# Patient Record
Sex: Female | Born: 1975 | Hispanic: No | Marital: Single | Smoking: Former smoker
Health system: Southern US, Community
[De-identification: ages and names within clinical notes are randomized; demographics above are authoritative.]

## PROBLEM LIST (undated history)

## (undated) DIAGNOSIS — Z789 Other specified health status: Secondary | ICD-10-CM

## (undated) HISTORY — PX: WISDOM TOOTH EXTRACTION: SHX21

## (undated) HISTORY — DX: Other specified health status: Z78.9

---

## 2004-12-11 ENCOUNTER — Emergency Department: Payer: Self-pay | Admitting: Emergency Medicine

## 2005-09-11 ENCOUNTER — Emergency Department: Payer: Self-pay | Admitting: Emergency Medicine

## 2006-10-27 ENCOUNTER — Emergency Department: Payer: Self-pay | Admitting: Emergency Medicine

## 2007-07-06 ENCOUNTER — Emergency Department: Payer: Self-pay

## 2007-07-08 ENCOUNTER — Emergency Department: Payer: Self-pay | Admitting: Emergency Medicine

## 2009-09-02 ENCOUNTER — Observation Stay: Payer: Self-pay | Admitting: Obstetrics and Gynecology

## 2009-11-11 ENCOUNTER — Observation Stay: Payer: Self-pay

## 2009-12-17 ENCOUNTER — Observation Stay: Payer: Self-pay | Admitting: Obstetrics and Gynecology

## 2009-12-20 ENCOUNTER — Inpatient Hospital Stay: Payer: Self-pay | Admitting: Obstetrics and Gynecology

## 2012-01-11 ENCOUNTER — Emergency Department: Payer: Self-pay | Admitting: Emergency Medicine

## 2013-06-25 ENCOUNTER — Emergency Department: Payer: Self-pay | Admitting: Emergency Medicine

## 2013-06-25 LAB — CBC WITH DIFFERENTIAL/PLATELET
BASOS PCT: 0.6 %
Basophil #: 0.1 10*3/uL (ref 0.0–0.1)
EOS ABS: 0.1 10*3/uL (ref 0.0–0.7)
Eosinophil %: 0.9 %
HCT: 37.1 % (ref 35.0–47.0)
HGB: 12.7 g/dL (ref 12.0–16.0)
LYMPHS ABS: 3.5 10*3/uL (ref 1.0–3.6)
Lymphocyte %: 36.2 %
MCH: 32.3 pg (ref 26.0–34.0)
MCHC: 34.2 g/dL (ref 32.0–36.0)
MCV: 95 fL (ref 80–100)
MONOS PCT: 7.8 %
Monocyte #: 0.8 x10 3/mm (ref 0.2–0.9)
NEUTROS ABS: 5.3 10*3/uL (ref 1.4–6.5)
Neutrophil %: 54.5 %
Platelet: 304 10*3/uL (ref 150–440)
RBC: 3.92 10*6/uL (ref 3.80–5.20)
RDW: 13.7 % (ref 11.5–14.5)
WBC: 9.7 10*3/uL (ref 3.6–11.0)

## 2013-06-25 LAB — HCG, QUANTITATIVE, PREGNANCY: BETA HCG, QUANT.: 1511 m[IU]/mL — AB

## 2013-07-25 ENCOUNTER — Emergency Department: Payer: Self-pay | Admitting: Emergency Medicine

## 2013-07-25 LAB — URINALYSIS, COMPLETE
BLOOD: NEGATIVE
Bilirubin,UR: NEGATIVE
GLUCOSE, UR: NEGATIVE mg/dL (ref 0–75)
Ketone: NEGATIVE
LEUKOCYTE ESTERASE: NEGATIVE
Nitrite: NEGATIVE
PH: 6 (ref 4.5–8.0)
PROTEIN: NEGATIVE
RBC,UR: <1 /HPF (ref 0–5)
SPECIFIC GRAVITY: 1.014 (ref 1.003–1.030)
Squamous Epithelial: 9

## 2013-07-25 LAB — COMPREHENSIVE METABOLIC PANEL
ALK PHOS: 42 U/L — AB
Albumin: 3.6 g/dL (ref 3.4–5.0)
Anion Gap: 9 (ref 7–16)
BUN: 8 mg/dL (ref 7–18)
Bilirubin,Total: 0.3 mg/dL (ref 0.2–1.0)
CHLORIDE: 105 mmol/L (ref 98–107)
Calcium, Total: 8.9 mg/dL (ref 8.5–10.1)
Co2: 23 mmol/L (ref 21–32)
Creatinine: 0.65 mg/dL (ref 0.60–1.30)
EGFR (African American): >60
EGFR (Non-African Amer.): >60
Glucose: 116 mg/dL — ABNORMAL HIGH (ref 65–99)
Osmolality: 273 (ref 275–301)
POTASSIUM: 3.6 mmol/L (ref 3.5–5.1)
SGOT(AST): 14 U/L — ABNORMAL LOW (ref 15–37)
SGPT (ALT): 19 U/L (ref 12–78)
Sodium: 137 mmol/L (ref 136–145)
Total Protein: 6.9 g/dL (ref 6.4–8.2)

## 2013-07-25 LAB — CBC WITH DIFFERENTIAL/PLATELET
BASOS ABS: 0 10*3/uL (ref 0.0–0.1)
Basophil %: 0.2 %
EOS ABS: 0.1 10*3/uL (ref 0.0–0.7)
EOS PCT: 0.9 %
HCT: 35.6 % (ref 35.0–47.0)
HGB: 11.7 g/dL — ABNORMAL LOW (ref 12.0–16.0)
LYMPHS ABS: 1.9 10*3/uL (ref 1.0–3.6)
Lymphocyte %: 25 %
MCH: 31 pg (ref 26.0–34.0)
MCHC: 32.7 g/dL (ref 32.0–36.0)
MCV: 95 fL (ref 80–100)
MONOS PCT: 7.9 %
Monocyte #: 0.6 x10 3/mm (ref 0.2–0.9)
NEUTROS PCT: 66 %
Neutrophil #: 5.1 10*3/uL (ref 1.4–6.5)
PLATELETS: 233 10*3/uL (ref 150–440)
RBC: 3.76 10*6/uL — AB (ref 3.80–5.20)
RDW: 13.5 % (ref 11.5–14.5)
WBC: 7.7 10*3/uL (ref 3.6–11.0)

## 2013-07-25 LAB — HCG, QUANTITATIVE, PREGNANCY: BETA HCG, QUANT.: 129304 m[IU]/mL — AB

## 2013-07-25 LAB — LIPASE, BLOOD: LIPASE: 194 U/L (ref 73–393)

## 2013-07-27 LAB — URINE CULTURE

## 2013-09-21 ENCOUNTER — Emergency Department: Payer: Self-pay | Admitting: Internal Medicine

## 2013-09-21 LAB — COMPREHENSIVE METABOLIC PANEL
ALK PHOS: 38 U/L — AB
ANION GAP: 7 (ref 7–16)
Albumin: 2.9 g/dL — ABNORMAL LOW (ref 3.4–5.0)
BUN: 8 mg/dL (ref 7–18)
Bilirubin,Total: 0.3 mg/dL (ref 0.2–1.0)
CO2: 22 mmol/L (ref 21–32)
Calcium, Total: 8.2 mg/dL — ABNORMAL LOW (ref 8.5–10.1)
Chloride: 110 mmol/L — ABNORMAL HIGH (ref 98–107)
Creatinine: 0.71 mg/dL (ref 0.60–1.30)
EGFR (African American): >60
EGFR (Non-African Amer.): >60
GLUCOSE: 124 mg/dL — AB (ref 65–99)
Osmolality: 277 (ref 275–301)
Potassium: 3.4 mmol/L — ABNORMAL LOW (ref 3.5–5.1)
SGOT(AST): 14 U/L — ABNORMAL LOW (ref 15–37)
SGPT (ALT): 14 U/L
Sodium: 139 mmol/L (ref 136–145)
TOTAL PROTEIN: 5.4 g/dL — AB (ref 6.4–8.2)

## 2013-09-21 LAB — CBC
HCT: 29.6 % — ABNORMAL LOW (ref 35.0–47.0)
HGB: 9.5 g/dL — AB (ref 12.0–16.0)
MCH: 30.9 pg (ref 26.0–34.0)
MCHC: 32 g/dL (ref 32.0–36.0)
MCV: 97 fL (ref 80–100)
PLATELETS: 155 10*3/uL (ref 150–440)
RBC: 3.06 10*6/uL — ABNORMAL LOW (ref 3.80–5.20)
RDW: 13.7 % (ref 11.5–14.5)
WBC: 10.6 10*3/uL (ref 3.6–11.0)

## 2013-09-21 LAB — HCG, QUANTITATIVE, PREGNANCY: BETA HCG, QUANT.: 52 m[IU]/mL — AB

## 2014-01-14 HISTORY — PX: BREAST SURGERY: SHX581

## 2015-02-22 ENCOUNTER — Emergency Department
Admission: EM | Admit: 2015-02-22 | Discharge: 2015-02-22 | Disposition: A | Discharge status: Home / Self Care | Payer: Self-pay | Attending: Emergency Medicine | Admitting: Emergency Medicine

## 2015-02-22 ENCOUNTER — Encounter: Payer: Self-pay | Admitting: Emergency Medicine

## 2015-02-22 ENCOUNTER — Emergency Department: Payer: Medicaid Other

## 2015-02-22 DIAGNOSIS — J069 Acute upper respiratory infection, unspecified: Secondary | ICD-10-CM | POA: Insufficient documentation

## 2015-02-22 DIAGNOSIS — F17200 Nicotine dependence, unspecified, uncomplicated: Secondary | ICD-10-CM | POA: Insufficient documentation

## 2015-02-22 LAB — RAPID INFLUENZA A&B ANTIGENS
Influenza A (ARMC): NOT DETECTED
Influenza B (ARMC): NOT DETECTED

## 2015-02-22 MED ORDER — IBUPROFEN 800 MG PO TABS
800.0000 mg | ORAL_TABLET | Freq: Once | ORAL | Status: AC
  Administered 2015-02-22: 800 mg via ORAL
  Filled 2015-02-22: qty 1

## 2015-02-22 MED ORDER — GUAIFENESIN-CODEINE 100-10 MG/5ML PO SOLN: 10.0000 mL | ORAL | Status: DC | PRN

## 2015-02-22 MED ORDER — AZITHROMYCIN 250 MG PO TABS: ORAL_TABLET | ORAL | Status: DC

## 2015-02-22 MED ORDER — IBUPROFEN 800 MG PO TABS: 800.0000 mg | ORAL_TABLET | Freq: Three times a day (TID) | ORAL | Status: DC | PRN

## 2015-02-22 NOTE — Discharge Instructions (Signed)
Upper Respiratory Infection, Adult Most upper respiratory infections (URIs) are a viral infection of the air passages leading to the lungs. A URI affects the nose, throat, and upper air passages. The most common type of URI is nasopharyngitis and is typically referred to as "the common cold." URIs run their course and usually go away on their own. Most of the time, a URI does not require medical attention, but sometimes a bacterial infection in the upper airways can follow a viral infection. This is called a secondary infection. Sinus and middle ear infections are common types of secondary upper respiratory infections. Bacterial pneumonia can also complicate a URI. A URI can worsen asthma and chronic obstructive pulmonary disease (COPD). Sometimes, these complications can require emergency medical care and may be life threatening.  CAUSES Almost all URIs are caused by viruses. A virus is a type of germ and can spread from one person to another.  RISKS FACTORS You may be at risk for a URI if:   You smoke.   You have chronic heart or lung disease.  You have a weakened defense (immune) system.   You are very young or very old.   You have nasal allergies or asthma.  You work in crowded or poorly ventilated areas.  You work in health care facilities or schools. SIGNS AND SYMPTOMS  Symptoms typically develop 2-3 days after you come in contact with a cold virus. Most viral URIs last 7-10 days. However, viral URIs from the influenza virus (flu virus) can last 14-18 days and are typically more severe. Symptoms may include:   Runny or stuffy (congested) nose.   Sneezing.   Cough.   Sore throat.   Headache.   Fatigue.   Fever.   Loss of appetite.   Pain in your forehead, behind your eyes, and over your cheekbones (sinus pain).  Muscle aches.  DIAGNOSIS  Your health care provider may diagnose a URI by:  Physical exam.  Tests to check that your symptoms are not due to  another condition such as:  Strep throat.  Sinusitis.  Pneumonia.  Asthma. TREATMENT  A URI goes away on its own with time. It cannot be cured with medicines, but medicines may be prescribed or recommended to relieve symptoms. Medicines may help:  Reduce your fever.  Reduce your cough.  Relieve nasal congestion. HOME CARE INSTRUCTIONS   Take medicines only as directed by your health care provider.   Gargle warm saltwater or take cough drops to comfort your throat as directed by your health care provider.  Use a warm mist humidifier or inhale steam from a shower to increase air moisture. This may make it easier to breathe.  Drink enough fluid to keep your urine clear or pale yellow.   Eat soups and other clear broths and maintain good nutrition.   Rest as needed.   Return to work when your temperature has returned to normal or as your health care provider advises. You may need to stay home longer to avoid infecting others. You can also use a face mask and careful hand washing to prevent spread of the virus.  Increase the usage of your inhaler if you have asthma.   Do not use any tobacco products, including cigarettes, chewing tobacco, or electronic cigarettes. If you need help quitting, ask your health care provider. PREVENTION  The best way to protect yourself from getting a cold is to practice good hygiene.   Avoid oral or hand contact with people with cold   symptoms.   Wash your hands often if contact occurs.  There is no clear evidence that vitamin C, vitamin E, echinacea, or exercise reduces the chance of developing a cold. However, it is always recommended to get plenty of rest, exercise, and practice good nutrition.  SEEK MEDICAL CARE IF:   You are getting worse rather than better.   Your symptoms are not controlled by medicine.   You have chills.  You have worsening shortness of breath.  You have brown or red mucus.  You have yellow or brown nasal  discharge.  You have pain in your face, especially when you bend forward.  You have a fever.  You have swollen neck glands.  You have pain while swallowing.  You have white areas in the back of your throat. SEEK IMMEDIATE MEDICAL CARE IF:   You have severe or persistent:  Headache.  Ear pain.  Sinus pain.  Chest pain.  You have chronic lung disease and any of the following:  Wheezing.  Prolonged cough.  Coughing up blood.  A change in your usual mucus.  You have a stiff neck.  You have changes in your:  Vision.  Hearing.  Thinking.  Mood. MAKE SURE YOU:   Understand these instructions.  Will watch your condition.  Will get help right away if you are not doing well or get worse.   This information is not intended to replace advice given to you by your health care provider. Make sure you discuss any questions you have with your health care provider.   Document Released: 06/26/2000 Document Revised: 05/17/2014 Document Reviewed: 04/07/2013 Elsevier Interactive Patient Education 2016 [employer]  

## 2015-02-22 NOTE — ED Provider Notes (Signed)
[facility] Emergency Department Provider Note  ____________________________________________  Time seen: Approximately 5:21 PM  I have reviewed the triage vital signs and the nursing notes.   HISTORY  Chief Complaint Influenza    HPI Jody Sanders is a 40 y.o. female who presents for evaluation of sudden onset of the fever chills body aches sinus congestion and cough suggests today. Has been taking DayQuil over-the-counter with no relief. States that she coughed all night long and no productive sputum.   History reviewed. No pertinent past medical history.  There are no active problems to display for this patient.   History reviewed. No pertinent past surgical history.  Current Outpatient Rx  Name  Route  Sig  Dispense  Refill  . azithromycin (ZITHROMAX Z-PAK) 250 MG tablet      Take 2 tablets (500 mg) on  Day 1,  followed by 1 tablet (250 mg) once daily on Days 2 through 5.   6 each   0   . guaiFENesin-codeine 100-10 MG/5ML syrup   Oral   Take 10 mLs by mouth every 4 (four) hours as needed for cough.   180 mL   0   . ibuprofen (ADVIL,MOTRIN) 800 MG tablet   Oral   Take 1 tablet (800 mg total) by mouth every 8 (eight) hours as needed.   30 tablet   0     Allergies Review of patient's allergies indicates no known allergies.  No family history on file.  Social History Social History  Substance Use Topics  . Smoking status: Light Tobacco Smoker  . Smokeless tobacco: None  . Alcohol Use: Yes    Review of Systems Constitutional: Positive fever/chills ENT: No sore throat. Cardiovascular: Denies chest pain. Respiratory: Denies shortness of breath. Positive for cough Gastrointestinal: No abdominal pain.  No nausea, no vomiting.  No diarrhea.  No constipation. Genitourinary: Negative for dysuria. Musculoskeletal: Positive for generalized body aches. Skin: Negative for rash. Neurological: Positive for headaches, negative for  focal weakness or numbness.  10-point ROS otherwise negative.  ____________________________________________   PHYSICAL EXAM:  VITAL SIGNS: ED Triage Vitals  Enc Vitals Group     BP 02/22/15 1717 114/84 mmHg     Pulse Rate 02/22/15 1717 98     Resp 02/22/15 1717 20     Temp 02/22/15 1717 100 F (37.8 C)     Temp Source 02/22/15 1717 Oral     SpO2 02/22/15 1717 97 %     Weight 02/22/15 1717 167 lb (75.751 kg)     Height 02/22/15 1717 5\' 3"  (1.6 m)     Head Cir --      Peak Flow --      Pain Score 02/22/15 1715 6     Pain Loc --      Pain Edu? --      Excl. in [place]? --     Constitutional: Alert and oriented. Well appearing and in no acute distress. Nose: Positive congestion/rhinnorhea, nasal turbinates swollen bilaterally.. Mouth/Throat: Mucous membranes are moist.  Oropharynx non-erythematous. Neck: No stridor.  No cervical adenopathy noted. Cardiovascular: Normal rate, regular rhythm. Grossly normal heart sounds.  Good peripheral circulation. Respiratory: Normal respiratory effort.  No retractions. Lungs CTAB. Musculoskeletal: No lower extremity tenderness nor edema.  No joint effusions. Neurologic:  Normal speech and language. No gross focal neurologic deficits are appreciated. No gait instability. Skin:  Skin is warm, dry and intact. No rash noted. Psychiatric: Mood and affect are normal. Speech and  behavior are normal.  ____________________________________________   LABS (all labs ordered are listed, but only abnormal results are displayed)  Labs Reviewed  RAPID INFLUENZA A&B ANTIGENS ([place])   RADIOLOGY   ____________________________________________   PROCEDURES  Procedure(s) performed: None  Critical Care performed: No  ____________________________________________   INITIAL IMPRESSION / ASSESSMENT AND PLAN / ED COURSE  Pertinent labs & imaging results that were available during my care of the patient were reviewed by me and considered in my  medical decision making (see chart for details).  Acute upper respiratory infection. Negative rapid flu and chest x-ray. Rx given for Z-Pak, Robitussin-AC, Motrin) milligrams 3 times a day and chlorpheniramine for drainage. Patient follow-up PCP or return to ER with any worsening symptomology. Work excuse 48 hours given. ____________________________________________   FINAL CLINICAL IMPRESSION(S) / ED DIAGNOSES  Final diagnoses:  URI, acute     This chart was dictated using voice recognition software/Dragon. Despite best efforts to proofread, errors can occur which can change the meaning. Any change was purely unintentional.   Arlyss Repress, PA-C 02/22/15 1901  Nance Pear, MD 02/22/15 [phone]

## 2015-02-22 NOTE — ED Notes (Signed)
Developed body aches with fever and chills since yesterday

## 2016-03-11 DIAGNOSIS — M19049 Primary osteoarthritis, unspecified hand: Secondary | ICD-10-CM | POA: Insufficient documentation

## 2017-01-02 LAB — HM PAP SMEAR: HM Pap smear: NEGATIVE

## 2017-07-22 ENCOUNTER — Emergency Department
Admission: EM | Admit: 2017-07-22 | Discharge: 2017-07-22 | Disposition: A | Discharge status: Home / Self Care | Payer: Self-pay | Attending: Emergency Medicine | Admitting: Emergency Medicine

## 2017-07-22 ENCOUNTER — Encounter: Payer: Self-pay | Admitting: Emergency Medicine

## 2017-07-22 ENCOUNTER — Emergency Department: Payer: Self-pay

## 2017-07-22 DIAGNOSIS — M2141 Flat foot [pes planus] (acquired), right foot: Secondary | ICD-10-CM | POA: Insufficient documentation

## 2017-07-22 DIAGNOSIS — M2142 Flat foot [pes planus] (acquired), left foot: Secondary | ICD-10-CM | POA: Insufficient documentation

## 2017-07-22 DIAGNOSIS — F17200 Nicotine dependence, unspecified, uncomplicated: Secondary | ICD-10-CM | POA: Insufficient documentation

## 2017-07-22 DIAGNOSIS — M722 Plantar fascial fibromatosis: Secondary | ICD-10-CM | POA: Insufficient documentation

## 2017-07-22 MED ORDER — TRAMADOL HCL 50 MG PO TABS: 50.0000 mg | ORAL_TABLET | Freq: Two times a day (BID) | ORAL | 0 refills | Status: DC | PRN

## 2017-07-22 MED ORDER — NAPROXEN 500 MG PO TABS: 500.0000 mg | ORAL_TABLET | Freq: Two times a day (BID) | ORAL | Status: DC

## 2017-07-22 NOTE — Discharge Instructions (Signed)
Advised trial of over-the-counter arch supports.

## 2017-07-22 NOTE — ED Provider Notes (Signed)
[facility] Emergency Department Provider Note   ____________________________________________   First MD Initiated Contact with Patient 07/22/17 [phone]     (approximate)  I have reviewed the triage vital signs and the nursing notes.   HISTORY  Chief Complaint Foot Pain    HPI Jody Sanders is a 42 y.o. female patient complaining left foot and ankle pain without provocative incident.  Patient that she knows the pain while driving last night.  Patient state pain increased this morning.  Patient the pain increased with ambulation.  Patient the pain radiates from her heel to the mid arch of the left foot.  Patient has similar complaint in the past but she states this is the way she ever experience.  Patient rates the pain as 8/10.  Palliative measures for complaint.  History reviewed. No pertinent past medical history.  There are no active problems to display for this patient.   History reviewed. No pertinent surgical history.  Prior to Admission medications   Medication Sig Start Date End Date Taking? Authorizing Provider  azithromycin (ZITHROMAX Z-PAK) 250 MG tablet Take 2 tablets (500 mg) on  Day 1,  followed by 1 tablet (250 mg) once daily on Days 2 through 5. 02/22/15   Beers, Pierce Crane, PA-C  guaiFENesin-codeine 100-10 MG/5ML syrup Take 10 mLs by mouth every 4 (four) hours as needed for cough. 02/22/15   Beers, Pierce Crane, PA-C  ibuprofen (ADVIL,MOTRIN) 800 MG tablet Take 1 tablet (800 mg total) by mouth every 8 (eight) hours as needed. 02/22/15   Beers, Pierce Crane, PA-C  naproxen (NAPROSYN) 500 MG tablet Take 1 tablet (500 mg total) by mouth 2 (two) times daily with a meal. 07/22/17   Sable Feil, PA-C  traMADol (ULTRAM) 50 MG tablet Take 1 tablet (50 mg total) by mouth every 12 (twelve) hours as needed. 07/22/17   Sable Feil, PA-C    Allergies Patient has no known allergies.  No family history on file.  Social History Social History   Tobacco  Use  . Smoking status: Light Tobacco Smoker  Substance Use Topics  . Alcohol use: Yes  . Drug use: Not on file    Review of Systems Constitutional: No fever/chills Eyes: No visual changes. ENT: No sore throat. Cardiovascular: Denies chest pain. Respiratory: Denies shortness of breath. Gastrointestinal: No abdominal pain.  No nausea, no vomiting.  No diarrhea.  No constipation. Genitourinary: Negative for dysuria. Musculoskeletal: Left foot and ankle pain. Skin: Negative for rash. Neurological: Negative for headaches, focal weakness or numbness.   ____________________________________________   PHYSICAL EXAM:  VITAL SIGNS: ED Triage Vitals  Enc Vitals Group     BP 07/22/17 0716 (!) 131/52     Pulse Rate 07/22/17 0716 80     Resp 07/22/17 0716 16     Temp 07/22/17 0716 98.3 F (36.8 C)     Temp Source 07/22/17 0716 Oral     SpO2 07/22/17 0716 99 %     Weight 07/22/17 0717 180 lb (81.6 kg)     Height 07/22/17 0717 5\' 3"  (1.6 m)     Head Circumference --      Peak Flow --      Pain Score 07/22/17 0717 8     Pain Loc --      Pain Edu? --      Excl. in [place]? --    Constitutional: Alert and oriented. Well appearing and in no acute distress. Cardiovascular: Normal rate, regular rhythm.  Grossly normal heart sounds.  Good peripheral circulation. Respiratory: Normal respiratory effort.  No retractions. Lungs CTAB. Musculoskeletal: Bilateral pes planus but no other obvious deformity.  Patient is moderate guarding palpation of the plantar calcaneus.  Patient has decreased range of motion with eversion. Neurologic:  Normal speech and language. No gross focal neurologic deficits are appreciated. No gait instability. Skin:  Skin is warm, dry and intact. No rash noted. Psychiatric: Mood and affect are normal. Speech and behavior are normal.  ____________________________________________   LABS (all labs ordered are listed, but only abnormal results are displayed)  Labs Reviewed -  No data to display ____________________________________________  EKG   ____________________________________________  RADIOLOGY  ED MD interpretation:    Official radiology report(s): Dg Ankle Complete Left  Result Date: 07/22/2017 CLINICAL DATA:  Onset of plantar and lateral ankle pain yesterday. The patient is able to bear weight. No definite injury. EXAM: LEFT ANKLE COMPLETE - 3+ VIEW COMPARISON:  None. FINDINGS: The bones are subjectively adequately mineralized. There is no acute fracture or dislocation. There is no lytic nor blastic lesion. The ankle joint mortise is preserved. The talar dome is intact. Minimal soft tissue swelling is present over the malleolar regions. IMPRESSION: There is no acute bony abnormality of the left ankle. Electronically Signed   By: David  Martinique M.D.   On: 07/22/2017 08:34    ____________________________________________   PROCEDURES  Procedure(s) performed:   Procedures  Critical Care performed: No  ____________________________________________   INITIAL IMPRESSION / ASSESSMENT AND PLAN / ED COURSE  As part of my medical decision making, I reviewed the following data within the electronic MEDICAL RECORD NUMBER    Left foot and ankle pain secondary to pes planus.  Discussed x-ray findings with patient.  Patient placed in Ace wrap.  Patient given discharge care instruction advised take medication as directed.  Patient advised to try over-the-counter inserts and schedule appointment with podiatry.      ____________________________________________   FINAL CLINICAL IMPRESSION(S) / ED DIAGNOSES  Final diagnoses:  Plantar fasciitis of right foot  Plantar fasciitis of left foot  Pes planus of both feet     ED Discharge Orders        Ordered    naproxen (NAPROSYN) 500 MG tablet  2 times daily with meals     07/22/17 0853    traMADol (ULTRAM) 50 MG tablet  Every 12 hours PRN     07/22/17 0853       Note:  This document was prepared  using Dragon voice recognition software and may include unintentional dictation errors.    Sable Feil, PA-C 07/22/17 4585    Lavonia Drafts, MD 07/22/17 [phone]

## 2017-07-22 NOTE — ED Triage Notes (Signed)
Patient complaining of right foot pain onset last PM while driving.  Woke this AM with same pain.  Denies injury.  Indicates pain inner aspect near arch.  Occurred while driving.

## 2018-02-21 ENCOUNTER — Emergency Department
Admission: EM | Admit: 2018-02-21 | Discharge: 2018-02-21 | Disposition: A | Discharge status: Home / Self Care | Payer: Self-pay | Attending: Emergency Medicine | Admitting: Emergency Medicine

## 2018-02-21 ENCOUNTER — Other Ambulatory Visit: Payer: Self-pay

## 2018-02-21 DIAGNOSIS — Z79899 Other long term (current) drug therapy: Secondary | ICD-10-CM | POA: Insufficient documentation

## 2018-02-21 DIAGNOSIS — Z720 Tobacco use: Secondary | ICD-10-CM | POA: Insufficient documentation

## 2018-02-21 DIAGNOSIS — J111 Influenza due to unidentified influenza virus with other respiratory manifestations: Secondary | ICD-10-CM | POA: Insufficient documentation

## 2018-02-21 MED ORDER — GUAIFENESIN-CODEINE 100-10 MG/5ML PO SYRP
5.0000 mL | ORAL_SOLUTION | Freq: Three times a day (TID) | ORAL | 0 refills | Status: DC | PRN
Start: 2018-02-21 — End: 2018-11-04

## 2018-02-21 MED ORDER — ACETAMINOPHEN 500 MG PO TABS
1000.0000 mg | ORAL_TABLET | Freq: Once | ORAL | Status: AC
  Administered 2018-02-21: 1000 mg via ORAL
  Filled 2018-02-21: qty 2

## 2018-02-21 MED ORDER — KETOROLAC TROMETHAMINE 30 MG/ML IJ SOLN
30.0000 mg | Freq: Once | INTRAMUSCULAR | Status: AC
  Administered 2018-02-21: 30 mg via INTRAMUSCULAR
  Filled 2018-02-21: qty 1

## 2018-02-21 NOTE — ED Triage Notes (Signed)
Pt states 3 days of cough, headache that began today and body aches. Pt states has had fever at home. Pt in no acute distress.

## 2018-02-21 NOTE — Discharge Instructions (Signed)
Stay well hydrated.  Take Extra Strength Tylenol and Ibuprofen in rotation for body aches, fever, and headache.  Follow up with primary care or return to the ER for symptoms of concern.

## 2018-02-22 NOTE — ED Provider Notes (Signed)
[facility] Emergency Department Provider Note  ____________________________________________  Time seen: Approximately 12:13 AM  I have reviewed the triage vital signs and the nursing notes.   HISTORY  Chief Complaint Cough and Headache   HPI Jody Sanders is a 43 y.o. female presents to the emergency department for treatment and evaluation of cough, headache, fever, and body aches.  She has had symptoms for 3 days.  No relief with over-the-counter medications.    No past medical history on file.  There are no active problems to display for this patient.   No past surgical history on file.  Prior to Admission medications   Medication Sig Start Date End Date Taking? Authorizing Provider  azithromycin (ZITHROMAX Z-PAK) 250 MG tablet Take 2 tablets (500 mg) on  Day 1,  followed by 1 tablet (250 mg) once daily on Days 2 through 5. 02/22/15   Beers, Pierce Crane, PA-C  guaiFENesin-codeine (ROBITUSSIN AC) 100-10 MG/5ML syrup Take 5 mLs by mouth 3 (three) times daily as needed for cough. 02/21/18   Teretha Chalupa, Johnette Abraham B, FNP  ibuprofen (ADVIL,MOTRIN) 800 MG tablet Take 1 tablet (800 mg total) by mouth every 8 (eight) hours as needed. 02/22/15   Beers, Pierce Crane, PA-C  naproxen (NAPROSYN) 500 MG tablet Take 1 tablet (500 mg total) by mouth 2 (two) times daily with a meal. 07/22/17   Sable Feil, PA-C  traMADol (ULTRAM) 50 MG tablet Take 1 tablet (50 mg total) by mouth every 12 (twelve) hours as needed. 07/22/17   Sable Feil, PA-C    Allergies Patient has no known allergies.  No family history on file.  Social History Social History   Tobacco Use  . Smoking status: Light Tobacco Smoker  Substance Use Topics  . Alcohol use: Yes  . Drug use: Not on file    Review of Systems Constitutional: Positive for fever/chills.  Decreased appetite. ENT: Positive for sore throat. Cardiovascular: Denies chest pain. Respiratory: Negative for shortness of breath.  Positive  for cough.  Negative for wheezing.  Gastrointestinal: No nausea, no vomiting.  Negative for diarrhea.  Musculoskeletal: Positive for body aches Skin: Negative for rash. Neurological: Positive for headaches ____________________________________________   PHYSICAL EXAM:  VITAL SIGNS: ED Triage Vitals [02/21/18 1932]  Enc Vitals Group     BP 137/78     Pulse Rate 87     Resp 16     Temp 99.2 F (37.3 C)     Temp Source Oral     SpO2 100 %     Weight 180 lb (81.6 kg)     Height 5\' 3"  (1.6 m)     Head Circumference      Peak Flow      Pain Score 9     Pain Loc      Pain Edu?      Excl. in [place]?     Constitutional: Alert and oriented.  Acutely ill appearing and in no acute distress. Eyes: Conjunctivae are normal. Ears: Bilateral tympanic membranes are injected and mildly erythematous Nose: No sinus congestion noted; no rhinnorhea. Mouth/Throat: Mucous membranes are moist.  Oropharynx erythematous. Tonsils 1+ without exudate. Uvula midline. Neck: No stridor.  Lymphatic: No cervical lymphadenopathy. Cardiovascular: Normal rate, regular rhythm. Good peripheral circulation. Respiratory: Respirations are even and unlabored.  No retractions.  Breath sounds clear. Gastrointestinal: Soft and nontender.  Musculoskeletal: FROM x 4 extremities.  Neurologic:  Normal speech and language. Skin:  Skin is warm, dry and intact.  No rash noted. Psychiatric: Mood and affect are normal. Speech and behavior are normal.  ____________________________________________   LABS (all labs ordered are listed, but only abnormal results are displayed)  Labs Reviewed - No data to display ____________________________________________  EKG  Not indicated ____________________________________________  RADIOLOGY  Not indicated ____________________________________________   PROCEDURES  Procedure(s) performed: None  Critical Care performed:  No ____________________________________________   INITIAL IMPRESSION / ASSESSMENT AND PLAN / ED COURSE  43 y.o. female presents to the emergency department for treatment and evaluation of flulike symptoms.  Her main complaint at this time is a headache.  She will be given an injection of Toradol.   Little improvement of the headache after Toradol.  She is driving therefore cannot give her anything stronger than Tylenol at this time.  Patient to be discharged home and will be treated with Robitussin-AC and advised to take Tylenol and ibuprofen for pain and fever.  She was advised to see primary care if not improving over the next few days.  She was instructed to return to the emergency department for symptoms of change or worsen if unable to schedule an appointment.    Medications  ketorolac (TORADOL) 30 MG/ML injection 30 mg (30 mg Intramuscular Given 02/21/18 2116)  acetaminophen (TYLENOL) tablet 1,000 mg (1,000 mg Oral Given 02/21/18 2146)    ED Discharge Orders         Ordered    guaiFENesin-codeine (ROBITUSSIN AC) 100-10 MG/5ML syrup  3 times daily PRN     02/21/18 2101           Pertinent labs & imaging results that were available during my care of the patient were reviewed by me and considered in my medical decision making (see chart for details).    If controlled substance prescribed during this visit, 12 month history viewed on the Power prior to issuing an initial prescription for Schedule II or III opiod. ____________________________________________   FINAL CLINICAL IMPRESSION(S) / ED DIAGNOSES  Final diagnoses:  Influenza    Note:  This document was prepared using Dragon voice recognition software and may include unintentional dictation errors.     Victorino Dike, FNP 02/22/18 0016    Carrie Mew, MD 02/27/18 [phone]

## 2018-10-07 ENCOUNTER — Other Ambulatory Visit: Payer: Self-pay | Admitting: *Deleted

## 2018-10-07 DIAGNOSIS — Z20822 Contact with and (suspected) exposure to covid-19: Secondary | ICD-10-CM

## 2018-10-08 LAB — NOVEL CORONAVIRUS, NAA: SARS-CoV-2, NAA: NOT DETECTED

## 2018-10-19 ENCOUNTER — Ambulatory Visit: Payer: Medicaid Other | Admitting: Maternal Newborn

## 2018-11-04 ENCOUNTER — Other Ambulatory Visit (HOSPITAL_COMMUNITY)
Admission: RE | Admit: 2018-11-04 | Discharge: 2018-11-04 | Disposition: A | Discharge status: Home / Self Care | Payer: Medicaid Other | Source: Ambulatory Visit | Attending: Maternal Newborn | Admitting: Maternal Newborn

## 2018-11-04 ENCOUNTER — Ambulatory Visit (INDEPENDENT_AMBULATORY_CARE_PROVIDER_SITE_OTHER): Payer: Medicaid Other | Admitting: Maternal Newborn

## 2018-11-04 ENCOUNTER — Other Ambulatory Visit: Payer: Self-pay

## 2018-11-04 ENCOUNTER — Encounter: Payer: Self-pay | Admitting: Maternal Newborn

## 2018-11-04 VITALS — BP 120/80 | HR 79 | Ht 63.0 in | Wt 180.0 lb

## 2018-11-04 DIAGNOSIS — Z1231 Encounter for screening mammogram for malignant neoplasm of breast: Secondary | ICD-10-CM

## 2018-11-04 DIAGNOSIS — Z0000 Encounter for general adult medical examination without abnormal findings: Secondary | ICD-10-CM | POA: Diagnosis not present

## 2018-11-04 DIAGNOSIS — Z113 Encounter for screening for infections with a predominantly sexual mode of transmission: Secondary | ICD-10-CM | POA: Insufficient documentation

## 2018-11-04 DIAGNOSIS — R109 Unspecified abdominal pain: Secondary | ICD-10-CM

## 2018-11-04 DIAGNOSIS — R197 Diarrhea, unspecified: Secondary | ICD-10-CM

## 2018-11-04 DIAGNOSIS — Z124 Encounter for screening for malignant neoplasm of cervix: Secondary | ICD-10-CM | POA: Diagnosis not present

## 2018-11-04 DIAGNOSIS — Z01419 Encounter for gynecological examination (general) (routine) without abnormal findings: Secondary | ICD-10-CM

## 2018-11-04 DIAGNOSIS — B379 Candidiasis, unspecified: Secondary | ICD-10-CM

## 2018-11-04 DIAGNOSIS — L739 Follicular disorder, unspecified: Secondary | ICD-10-CM

## 2018-11-04 MED ORDER — CLOTRIMAZOLE-BETAMETHASONE 1-0.05 % EX CREA: 1.0000 "application " | TOPICAL_CREAM | Freq: Two times a day (BID) | CUTANEOUS | 0 refills | Status: DC

## 2018-11-04 MED ORDER — CEPHALEXIN 500 MG PO CAPS: 500.0000 mg | ORAL_CAPSULE | Freq: Two times a day (BID) | ORAL | 0 refills | Status: AC

## 2018-11-04 MED ORDER — FLUCONAZOLE 150 MG PO TABS: 150.0000 mg | ORAL_TABLET | Freq: Once | ORAL | 0 refills | Status: AC

## 2018-11-04 NOTE — Progress Notes (Signed)
Gynecology Annual Exam  PCP: [facility] [place]  Chief Complaint:  Chief Complaint  Patient presents with  . Gynecologic Exam    rash    History of Present Illness: Patient is a 43 y.o. G0P0000 presenting for an annual exam. The patient has no complaints today.   LMP: Patient's last menstrual period was 10/24/2018 (exact date). Average Interval: regular, monthly Duration of flow: several days Heavy Menses: yes Clots: yes Intermenstrual Bleeding: no Postcoital Bleeding: no Dysmenorrhea: no  The patient is sexually active. She currently uses condoms for contraception. She does not dyspareunia.  The patient does perform self breast exams.  There is no notable family history of breast or ovarian cancer in her family.  The patient wears seatbelts: yes.   The patient has regular exercise: yes.  Recently quit smoking.  The patient does not have current symptoms of depression.    She notes that she has been having burning stomach pain with frequent diarrhea/loose stools.This problem began 6-7 months ago. She has tried Immodium, which did not relieve the symptoms. She has these symptoms several times a week. They seem to be unrelated to foods that she is eating.  She has a rash in the pubic area that began about a month ago after she had the area waxed. She has had some pustules that have healed, but has continued irritation and itching with some persistent raised spots. She has been using ointment on the area without much relief.  Review of Systems  Constitutional: Negative.   HENT: Negative.   Eyes: Negative.   Respiratory: Negative for shortness of breath and wheezing.   Cardiovascular: Negative for chest pain and palpitations.  Gastrointestinal: Positive for abdominal pain and diarrhea.  Genitourinary: Negative.   Musculoskeletal: Negative.   Skin: Positive for rash.  Neurological: Negative.   Endo/Heme/Allergies: Negative.   Psychiatric/Behavioral:  Negative.   All other systems reviewed and are negative.   Past Medical History:  No past medical history on file.  Past Surgical History:  Past Surgical History:  Procedure Laterality Date  . WISDOM TOOTH EXTRACTION      Gynecologic History:  Patient's last menstrual period was 10/24/2018 (exact date). Contraception: condoms Last Pap: Several years ago Last mammogram: No previous mammograms  Family History:  Family History  Problem Relation Age of Onset  . Diabetes Mother   . Thyroid disease Mother   . COPD Mother     Social History:  Social History   Socioeconomic History  . Marital status: Single    Spouse name: Not on file  . Number of children: 2  . Years of education: 40  . Highest education level: Not on file  Occupational History  . Occupation: [employer]  Social Needs  . Financial resource strain: Not on file  . Food insecurity    Worry: Not on file    Inability: Not on file  . Transportation needs    Medical: Not on file    Non-medical: Not on file  Tobacco Use  . Smoking status: Former Smoker    Quit date: 10/21/2018    Years since quitting: 0.0  . Smokeless tobacco: Never Used  Substance and Sexual Activity  . Alcohol use: Yes  . Drug use: Never  . Sexual activity: Yes    Birth control/protection: Condom  Lifestyle  . Physical activity    Days per week: Not on file    Minutes per session: Not on file  . Stress: Not on file  Relationships  . Social Herbalist on phone: Not on file    Gets together: Not on file    Attends religious service: Not on file    Active member of club or organization: Not on file    Attends meetings of clubs or organizations: Not on file    Relationship status: Not on file  . Intimate partner violence    Fear of current or ex partner: Not on file    Emotionally abused: Not on file    Physically abused: Not on file    Forced sexual activity: Not on file  Other Topics Concern  . Not on file  Social  History Narrative  . Not on file    Allergies:  No Known Allergies  Medications: Prior to Admission medications   Medication Sig Start Date End Date Taking? Authorizing Provider  azithromycin (ZITHROMAX Z-PAK) 250 MG tablet Take 2 tablets (500 mg) on  Day 1,  followed by 1 tablet (250 mg) once daily on Days 2 through 5. 02/22/15   Beers, Pierce Crane, PA-C  guaiFENesin-codeine (ROBITUSSIN AC) 100-10 MG/5ML syrup Take 5 mLs by mouth 3 (three) times daily as needed for cough. 02/21/18   Triplett, Johnette Abraham B, FNP  ibuprofen (ADVIL,MOTRIN) 800 MG tablet Take 1 tablet (800 mg total) by mouth every 8 (eight) hours as needed. 02/22/15   Beers, Pierce Crane, PA-C  naproxen (NAPROSYN) 500 MG tablet Take 1 tablet (500 mg total) by mouth 2 (two) times daily with a meal. 07/22/17   Sable Feil, PA-C  traMADol (ULTRAM) 50 MG tablet Take 1 tablet (50 mg total) by mouth every 12 (twelve) hours as needed. 07/22/17   Sable Feil, PA-C    Physical Exam Vitals: Blood pressure 120/80, pulse 79, height 5\' 3"  (1.6 m), weight 180 lb (81.6 kg), last menstrual period 10/24/2018.  General: NAD HEENT: normocephalic, anicteric Thyroid: no enlargement Pulmonary: No increased work of breathing, CTAB Cardiovascular: RRR, no murmurs, rubs, or gallops Breast: Breasts symmetrical, no tenderness, no palpable nodules or masses, no skin or nipple retraction present, no nipple discharge.  No axillary or supraclavicular lymphadenopathy. Abdomen: Soft, non-tender, non-distended.  Umbilicus without lesions.  No hepatomegaly, splenomegaly or masses palpable. No evidence of hernia  Genitourinary:  External: Folliculitis present on mons pubis with some  healed areas and some small pustules. Otherwise,  normal  external female genitalia.  Normal urethral  meatus,  normal Bartholin's and Skene's glands.    Vagina: Normal vaginal mucosa, no evidence of prolapse.    Cervix: Grossly normal in appearance, no bleeding  Uterus: Non-enlarged,  mobile, normal contour.  No CMT  Adnexa: ovaries non-enlarged, no adnexal masses  Rectal: deferred  Lymphatic: no evidence of inguinal lymphadenopathy Extremities: no edema, erythema, or tenderness Neurologic: Grossly intact Psychiatric: mood appropriate, affect full  Assessment: 43 y.o. G0P0000 here for an annual exam  Plan: Problem List Items Addressed This Visit    None    Visit Diagnoses    Encounter for annual routine gynecological examination    -  Primary   Breast cancer screening by mammogram       Relevant Orders   MM DIGITAL SCREENING BILATERAL   Folliculitis       Relevant Medications   clotrimazole-betamethasone (LOTRISONE) cream   cephALEXin (KEFLEX) 500 MG capsule   Yeast infection       Relevant Medications   clotrimazole-betamethasone (LOTRISONE) cream   cephALEXin (KEFLEX) 500 MG capsule   fluconazole (DIFLUCAN) 150  MG tablet   Pap smear for cervical cancer screening       Relevant Orders   Cytology - PAP   Routine screening for STI (sexually transmitted infection)       Relevant Orders   Cytology - PAP      1) Mammogram - recommend yearly screening mammogram.  Mammogram was ordered today  2) STI screening was offered and accepted  3) ASCCP guidelines and rationale discussed.  Patient opts for every 3 year screening interval  4) Contraception - Currently using condoms, satisfied with this method for now.  5) Rx sent for topical cream and short course of oral antibiotics for persistent folliculitis.  6) Refer to GI for ongoing stomach pain with diarrhea.  7) Follow up in one year for an annual exam.  Avel Sensor, CNM 11/04/2018  5:15 PM

## 2018-11-04 NOTE — Patient Instructions (Signed)

## 2018-11-09 LAB — CYTOLOGY - PAP
Chlamydia: NEGATIVE
Comment: NEGATIVE
Comment: NEGATIVE
Comment: NEGATIVE
Comment: NORMAL
Diagnosis: NEGATIVE
High risk HPV: NEGATIVE
Neisseria Gonorrhea: NEGATIVE
Trichomonas: NEGATIVE

## 2018-11-19 ENCOUNTER — Encounter: Payer: Self-pay | Admitting: Maternal Newborn

## 2018-11-19 ENCOUNTER — Other Ambulatory Visit: Payer: Self-pay | Admitting: Maternal Newborn

## 2018-11-19 DIAGNOSIS — Z1231 Encounter for screening mammogram for malignant neoplasm of breast: Secondary | ICD-10-CM

## 2018-11-23 ENCOUNTER — Other Ambulatory Visit: Payer: Self-pay | Admitting: Maternal Newborn

## 2018-11-23 DIAGNOSIS — Z1231 Encounter for screening mammogram for malignant neoplasm of breast: Secondary | ICD-10-CM

## 2018-11-30 ENCOUNTER — Telehealth: Payer: Self-pay | Admitting: Maternal Newborn

## 2018-11-30 NOTE — Telephone Encounter (Signed)
Called and spoke with patient she is aware of her appointment at [facility] on Thursday 12/03/2018 @ 1:20 pm.

## 2019-01-04 ENCOUNTER — Other Ambulatory Visit: Payer: Self-pay

## 2019-01-04 ENCOUNTER — Encounter: Payer: Self-pay | Admitting: Gastroenterology

## 2019-01-04 ENCOUNTER — Ambulatory Visit: Payer: Medicaid Other | Admitting: Gastroenterology

## 2019-01-04 VITALS — BP 138/88 | HR 101 | Temp 98.3°F | Ht 63.0 in | Wt 174.5 lb

## 2019-01-04 DIAGNOSIS — K625 Hemorrhage of anus and rectum: Secondary | ICD-10-CM | POA: Diagnosis not present

## 2019-01-04 DIAGNOSIS — K644 Residual hemorrhoidal skin tags: Secondary | ICD-10-CM | POA: Diagnosis not present

## 2019-01-04 DIAGNOSIS — R1013 Epigastric pain: Secondary | ICD-10-CM

## 2019-01-04 MED ORDER — NA SULFATE-K SULFATE-MG SULF 17.5-3.13-1.6 GM/177ML PO SOLN: 354.0000 mL | Freq: Once | ORAL | 0 refills | Status: AC

## 2019-01-04 NOTE — Progress Notes (Signed)
Jody Sanders [address]  [place]  [place], [place] [address]  Main: [phone]  Fax: [phone]   Gastroenterology Consultation  Referring Provider:     Rexene Agent, CNM Primary Care Physician:  [facility] Reason for Consultation: Stomach pain, diarrhea        HPI:    Chief Complaint  Patient presents with  . New Patient (Initial Visit)  . Diarrhea    Patient states she has had a burning feeling in stomach and diarrhea. it comes and goes     Jody Sanders is a 43 y.o. y/o female referred for consultation & management  by Dr. Domingo Madeira, [place].  Patient reports 1 year history of altered bowel habits.  Reports 3-4 loose stools a day, however does have days where she will not have a bowel movement.  Has noticed blood in her stool about 3-4 times.  Last episode was about 2 to 3 months ago.  She describes this as red blood streaks in stool.  No weight loss.  Describes an abdominal pain to be in the epigastric to mid abdominal region, burning, nonradiating, 5/10.  Reports history of a colonoscopy about 18 years ago otherwise had an episode of blood in her stool at that filled the whole toilet bowl.  States hemorrhoids were found.  We do not have these previous colonoscopy records.  Does feel extra skin when she wipes.  No family history of colon cancer.  History reviewed. No pertinent past medical history.  Past Surgical History:  Procedure Laterality Date  . WISDOM TOOTH EXTRACTION      Prior to Admission medications   Medication Sig Start Date End Date Taking? Authorizing Provider  Na Sulfate-K Sulfate-Mg Sulf 17.5-3.13-1.6 GM/177ML SOLN Take 354 mLs by mouth once for 1 dose. 01/04/19 01/04/19  Virgel Manifold, MD    Family History  Problem Relation Age of Onset  . Diabetes Mother   . Thyroid disease Mother   . COPD Mother      Social History   Tobacco Use  . Smoking status: Former Smoker      Types: Cigarettes    Quit date: 10/21/2018    Years since quitting: 0.2  . Smokeless tobacco: Never Used  Substance Use Topics  . Alcohol use: Yes  . Drug use: Never    Allergies as of 01/04/2019  . (No Known Allergies)    Review of Systems:    All systems reviewed and negative except where noted in HPI.   Physical Exam:  BP 138/88 (BP Location: Left Arm, Patient Position: Sitting, Cuff Size: Normal)   Pulse (!) 101   Temp 98.3 F (36.8 C) (Oral)   Ht 5\' 3"  (1.6 m)   Wt 174 lb 8 oz (79.2 kg)   BMI 30.91 kg/m  No LMP recorded. (Menstrual status: Other). Psych:  Alert and cooperative. Normal mood and affect. General:   Alert,  Well-developed, well-nourished, pleasant and cooperative in NAD Head:  Normocephalic and atraumatic. Eyes:  Sclera clear, no icterus.   Conjunctiva pink. Ears:  Normal auditory acuity. Nose:  No deformity, discharge, or lesions. Mouth:  No deformity or lesions,oropharynx pink & moist. Neck:  Supple; no masses or thyromegaly. Abdomen:  Normal bowel sounds.  No bruits.  Soft, non-tender and non-distended without masses, hepatosplenomegaly or hernias noted.  No guarding or rebound tenderness.    Rectal exam: External hemorrhoids, nonbleeding, DRE with yellow stool in the rectal vault Msk:  Symmetrical without gross deformities. Good, equal movement & strength bilaterally. Pulses:  Normal pulses noted. Extremities:  No clubbing or edema.  No cyanosis. Neurologic:  Alert and oriented x3;  grossly normal neurologically. Skin:  Intact without significant lesions or rashes. No jaundice. Lymph Nodes:  No significant cervical adenopathy. Psych:  Alert and cooperative. Normal mood and affect.   Labs: CBC    Component Value Date/Time   WBC 10.6 09/21/2013 1353   RBC 3.06 (L) 09/21/2013 1353   HGB 9.5 (L) 09/21/2013 1353   HCT 29.6 (L) 09/21/2013 1353   PLT 155 09/21/2013 1353   MCV 97 09/21/2013 1353   MCH 30.9 09/21/2013 1353   MCHC 32.0 09/21/2013  1353   RDW 13.7 09/21/2013 1353   LYMPHSABS 1.9 07/25/2013 1644   MONOABS 0.6 07/25/2013 1644   EOSABS 0.1 07/25/2013 1644   BASOSABS 0.0 07/25/2013 1644   CMP     Component Value Date/Time   NA 139 09/21/2013 1353   K 3.4 (L) 09/21/2013 1353   CL 110 (H) 09/21/2013 1353   CO2 22 09/21/2013 1353   GLUCOSE 124 (H) 09/21/2013 1353   BUN 8 09/21/2013 1353   CREATININE 0.71 09/21/2013 1353   CALCIUM 8.2 (L) 09/21/2013 1353   PROT 5.4 (L) 09/21/2013 1353   ALBUMIN 2.9 (L) 09/21/2013 1353   AST 14 (L) 09/21/2013 1353   ALT 14 09/21/2013 1353   ALKPHOS 38 (L) 09/21/2013 1353   BILITOT 0.3 09/21/2013 1353   GFRNONAA >60 09/21/2013 1353   GFRAA >60 09/21/2013 1353    Imaging Studies: No results found.  Assessment and Plan:   Jody Sanders is a 43 y.o. y/o female has been referred for altered bowel habits, diarrhea, epigastric pain  Diarrhea unlikely to be from infectious causes given chronic symptoms over the last year  However, patient also reports blood streaks in stool intermittently.  We discussed that this is likely due to her underlying hemorrhoids, however she is not having constipation at this time and is interested in colonoscopy for visualization  This will help [place] rule out any underlying lesions as well  Colonoscopy indicated for rectal bleeding and altered bowel habits.  Obtain biopsies for microscopic colitis during the procedure  EGD indicated for ongoing epigastric pain  Patient also interested in surgical referral for external hemorrhoids as she states she has tried topical therapy which has not helped    Dr Jody Sanders  Speech recognition software was used to dictate the above note.

## 2019-01-11 ENCOUNTER — Telehealth: Payer: Self-pay

## 2019-01-11 NOTE — Telephone Encounter (Signed)
Trish in ENDO called stating that the letter stating the patient was positive will not work. They need the test results that she was positive. Informed Trish when patient showed me the letter I called Kieth Brightly and she stated that the letter would work. Trish said no they need the results. Called the patient and patient states she will try to track down the results and fax it to [place].

## 2019-01-13 ENCOUNTER — Encounter: Payer: Self-pay | Admitting: Surgery

## 2019-01-13 ENCOUNTER — Ambulatory Visit (INDEPENDENT_AMBULATORY_CARE_PROVIDER_SITE_OTHER): Payer: Medicaid Other | Admitting: Surgery

## 2019-01-13 ENCOUNTER — Other Ambulatory Visit: Payer: Self-pay

## 2019-01-13 VITALS — BP 113/79 | HR 77 | Temp 97.5°F | Ht 63.0 in | Wt 178.0 lb

## 2019-01-13 DIAGNOSIS — K648 Other hemorrhoids: Secondary | ICD-10-CM | POA: Diagnosis not present

## 2019-01-13 DIAGNOSIS — K644 Residual hemorrhoidal skin tags: Secondary | ICD-10-CM

## 2019-01-13 MED ORDER — LIDOCAINE (ANORECTAL) 5 % EX CREA: 1.0000 | TOPICAL_CREAM | Freq: Four times a day (QID) | CUTANEOUS | 0 refills | Status: DC | PRN

## 2019-01-13 MED ORDER — HYDROCORTISONE (PERIANAL) 2.5 % EX CREA: 1.0000 "application " | TOPICAL_CREAM | Freq: Two times a day (BID) | CUTANEOUS | 0 refills | Status: DC

## 2019-01-13 NOTE — Progress Notes (Signed)
01/13/2019  Reason for Visit:  Hemorrhoids  Referring Provider:  Vonda Antigua, MD  History of Present Illness: Jody Sanders is a 43 y.o. female presenting for evaluation of hemorrhoids.  The patient reports that she has had issues with hemorrhoids since about 9 years ago, since her last child was born.  She reports that more recently, she has had issues with feeling pain and swelling sensation at the anal verge, as well as occasional bleeding.  She feels that something protrudes and describes it as a "thumb" at the anal verge.  She has tried suppositories, preparation H, witch hazel, but has not noted any significant improvement.  She has been following with Dr. Bonna Gains for abdominal discomfort and diarrhea.  She reports that more recently the diarrhea has been improving.  Past Medical History: History reviewed. No pertinent past medical history.   Past Surgical History: Past Surgical History:  Procedure Laterality Date  . BREAST SURGERY  2016   Breast Reduction  . WISDOM TOOTH EXTRACTION      Home Medications: Prior to Admission medications   Medication Sig Start Date End Date Taking? Authorizing Provider  hydrocortisone (ANUSOL-HC) 2.5 % rectal cream Place 1 application rectally 2 (two) times daily. 01/13/19   Rasheen Bells, Jacqulyn Bath, MD  Lidocaine, Anorectal, 5 % CREA Apply 1 applicator topically 4 (four) times daily as needed. 01/13/19   Olean Ree, MD    Allergies: No Known Allergies  Social History:  reports that she quit smoking about 2 months ago. Her smoking use included cigarettes. She has never used smokeless tobacco. She reports current alcohol use. She reports that she does not use drugs.   Family History: Family History  Problem Relation Age of Onset  . Diabetes Mother   . Thyroid disease Mother   . COPD Mother     Review of Systems: Review of Systems  Constitutional: Negative for chills and fever.  HENT: Negative for hearing loss.   Respiratory:  Negative for shortness of breath.   Cardiovascular: Negative for chest pain.  Gastrointestinal: Positive for abdominal pain, blood in stool and diarrhea. Negative for constipation, nausea and vomiting.  Genitourinary: Negative for dysuria.  Musculoskeletal: Negative for myalgias.  Skin: Negative for rash.  Neurological: Negative for dizziness.  Psychiatric/Behavioral: Negative for depression.    Physical Exam BP 113/79   Pulse 77   Temp (!) 97.5 F (36.4 C) (Temporal)   Ht 5\' 3"  (1.6 m)   Wt 80.7 kg   SpO2 97%   BMI 31.53 kg/m  CONSTITUTIONAL: No acute distress. HEENT:  Normocephalic, atraumatic, extraocular motion intact. NECK: Trachea is midline, and there is no jugular venous distension.  RESPIRATORY:  Lungs are clear, and breath sounds are equal bilaterally. Normal respiratory effort without pathologic use of accessory muscles. CARDIOVASCULAR: Heart is regular without murmurs, gallops, or rubs. GI: The abdomen is soft, non-distended, non-tender.  RECTAL:  External exam reveals evidence of anal skin tags or external hemorrhoidal tissue.  The right posterior component is mildly enlarged and tender to palpation, but there is no significant swelling/induration or any evidence of thrombosis.  Digital rectal exam reveals mildly enlarged internal component of the right posterior column.  No gross blood on glove. MUSCULOSKELETAL:  Normal muscle strength and tone in all four extremities.  No peripheral edema or cyanosis. SKIN: Skin turgor is normal. There are no pathologic skin lesions.  NEUROLOGIC:  Motor and sensation is grossly normal.  Cranial nerves are grossly intact. PSYCH:  Alert and oriented to person, place  and time. Affect is normal.  Laboratory Analysis: No results found for this or any previous visit (from the past 24 hour(s)).  Imaging: No results found.  Assessment and Plan: This is a 43 y.o. female with internal and external hemorrhoids.  --Discussed with the  patient the differences between internal and external hemorrhoids, what happens during flareups, and how to manage them. --Discussed with her that we should first try a conservative approach.  To help with her diarrhea, she can start taking Metamucil or Benefiber to help bulk her stool.  This may help improve any irritation to the hemorrhoids.  She should also do Sitz baths at least twice daily or after bowel movements to soothe the tissues.  I will also prescribe her anusol ointment to help reduce any residual inflammation of the external component as well as lidocaine ointment to help with any residual pain.   --If there is no improvement in her symptoms, then the last resort would be surgery to excise the internal and external components.  Discussed with her that there is a degree of pain or discomfort after surgery given the innervation of the external component.   --With regards to the internal component, another possibility would be band ligation of the excess tissue.  Discussed with her that she could address this with Dr. Bonna Gains as we do not do band ligation in our office. --Patient will follow up in a month to assess her progress.  Face-to-face time spent with the patient and care providers was 40 minutes, with more than 50% of the time spent counseling, educating, and coordinating care of the patient.     Melvyn Neth, [facility]

## 2019-01-13 NOTE — Patient Instructions (Addendum)
Recommend patient to try over the counter Metamucil or Benefiber add to your liquids once a day, if that does not help. Patient may try twice a day.    Hemorrhoids Hemorrhoids are swollen veins in and around the rectum or anus. There are two types of hemorrhoids:  Internal hemorrhoids. These occur in the veins that are just inside the rectum. They may poke through to the outside and become irritated and painful.  External hemorrhoids. These occur in the veins that are outside the anus and can be felt as a painful swelling or hard lump near the anus. Most hemorrhoids do not cause serious problems, and they can be managed with home treatments such as diet and lifestyle changes. If home treatments do not help the symptoms, procedures can be done to shrink or remove the hemorrhoids. What are the causes? This condition is caused by increased pressure in the anal area. This pressure may result from various things, including:  Constipation.  Straining to have a bowel movement.  Diarrhea.  Pregnancy.  Obesity.  Sitting for long periods of time.  Heavy lifting or other activity that causes you to strain.  Anal sex.  Riding a bike for a long period of time. What are the signs or symptoms? Symptoms of this condition include:  Pain.  Anal itching or irritation.  Rectal bleeding.  Leakage of stool (feces).  Anal swelling.  One or more lumps around the anus. How is this diagnosed? This condition can often be diagnosed through a visual exam. Other exams or tests may also be done, such as:  An exam that involves feeling the rectal area with a gloved hand (digital rectal exam).  An exam of the anal canal that is done using a small tube (anoscope).  A blood test, if you have lost a significant amount of blood.  A test to look inside the colon using a flexible tube with a camera on the end (sigmoidoscopy or colonoscopy). How is this treated? This condition can usually be treated  at home. However, various procedures may be done if dietary changes, lifestyle changes, and other home treatments do not help your symptoms. These procedures can help make the hemorrhoids smaller or remove them completely. Some of these procedures involve surgery, and others do not. Common procedures include:  Rubber band ligation. Rubber bands are placed at the base of the hemorrhoids to cut off their blood supply.  Sclerotherapy. Medicine is injected into the hemorrhoids to shrink them.  Infrared coagulation. A type of light energy is used to get rid of the hemorrhoids.  Hemorrhoidectomy surgery. The hemorrhoids are surgically removed, and the veins that supply them are tied off.  Stapled hemorrhoidopexy surgery. The surgeon staples the base of the hemorrhoid to the rectal wall. Follow these instructions at home: Eating and drinking   Eat foods that have a lot of fiber in them, such as whole grains, beans, nuts, fruits, and vegetables.  Ask your health care provider about taking products that have added fiber (fiber supplements).  Reduce the amount of fat in your diet. You can do this by eating low-fat dairy products, eating less red meat, and avoiding processed foods.  Drink enough fluid to keep your urine pale yellow. Managing pain and swelling   Take warm sitz baths for 20 minutes, 3-4 times a day to ease pain and discomfort. You may do this in a bathtub or using a portable sitz bath that fits over the toilet.  If directed, apply ice to  the affected area. Using ice packs between sitz baths may be helpful. ? Put ice in a plastic bag. ? Place a towel between your skin and the bag. ? Leave the ice on for 20 minutes, 2-3 times a day. General instructions  Take over-the-counter and prescription medicines only as told by your health care provider.  Use medicated creams or suppositories as told.  Get regular exercise. Ask your health care provider how much and what kind of exercise  is best for you. In general, you should do moderate exercise for at least 30 minutes on most days of the week (150 minutes each week). This can include activities such as walking, biking, or yoga.  Go to the bathroom when you have the urge to have a bowel movement. Do not wait.  Avoid straining to have bowel movements.  Keep the anal area dry and clean. Use wet toilet paper or moist towelettes after a bowel movement.  Do not sit on the toilet for long periods of time. This increases blood pooling and pain.  Keep all follow-up visits as told by your health care provider. This is important. Contact a health care provider if you have:  Increasing pain and swelling that are not controlled by treatment or medicine.  Difficulty having a bowel movement, or you are unable to have a bowel movement.  Pain or inflammation outside the area of the hemorrhoids. Get help right away if you have:  Uncontrolled bleeding from your rectum. Summary  Hemorrhoids are swollen veins in and around the rectum or anus.  Most hemorrhoids can be managed with home treatments such as diet and lifestyle changes.  Taking warm sitz baths can help ease pain and discomfort.  In severe cases, procedures or surgery can be done to shrink or remove the hemorrhoids. This information is not intended to replace advice given to you by your health care provider. Make sure you discuss any questions you have with your health care provider. Document Released: 12/29/1999 Document Revised: 01/08/2018 Document Reviewed: 05/22/2017 Elsevier Patient Education  2020 [employer].

## 2019-01-18 ENCOUNTER — Ambulatory Visit: Payer: Medicaid Other | Admitting: Surgery

## 2019-01-26 ENCOUNTER — Other Ambulatory Visit: Admission: RE | Admit: 2019-01-26 | Payer: Medicaid Other | Source: Ambulatory Visit

## 2019-01-28 ENCOUNTER — Ambulatory Visit: Payer: Medicaid Other | Admitting: Certified Registered"

## 2019-01-28 ENCOUNTER — Other Ambulatory Visit: Payer: Self-pay

## 2019-01-28 ENCOUNTER — Encounter
Admission: RE | Disposition: A | Discharge status: Home / Self Care | Payer: Self-pay | Source: Home / Self Care | Attending: Gastroenterology

## 2019-01-28 ENCOUNTER — Encounter: Payer: Self-pay | Admitting: Gastroenterology

## 2019-01-28 ENCOUNTER — Ambulatory Visit
Admission: RE | Admit: 2019-01-28 | Discharge: 2019-01-28 | Disposition: A | Discharge status: Home / Self Care | Payer: Medicaid Other | Attending: Gastroenterology | Admitting: Gastroenterology

## 2019-01-28 DIAGNOSIS — D125 Benign neoplasm of sigmoid colon: Secondary | ICD-10-CM | POA: Diagnosis not present

## 2019-01-28 DIAGNOSIS — R197 Diarrhea, unspecified: Secondary | ICD-10-CM | POA: Diagnosis not present

## 2019-01-28 DIAGNOSIS — K644 Residual hemorrhoidal skin tags: Secondary | ICD-10-CM

## 2019-01-28 DIAGNOSIS — D124 Benign neoplasm of descending colon: Secondary | ICD-10-CM | POA: Diagnosis not present

## 2019-01-28 DIAGNOSIS — D130 Benign neoplasm of esophagus: Secondary | ICD-10-CM | POA: Insufficient documentation

## 2019-01-28 DIAGNOSIS — Z87891 Personal history of nicotine dependence: Secondary | ICD-10-CM | POA: Diagnosis not present

## 2019-01-28 DIAGNOSIS — Q438 Other specified congenital malformations of intestine: Secondary | ICD-10-CM | POA: Insufficient documentation

## 2019-01-28 DIAGNOSIS — R1013 Epigastric pain: Secondary | ICD-10-CM | POA: Diagnosis present

## 2019-01-28 DIAGNOSIS — K228 Other specified diseases of esophagus: Secondary | ICD-10-CM

## 2019-01-28 DIAGNOSIS — K635 Polyp of colon: Secondary | ICD-10-CM

## 2019-01-28 DIAGNOSIS — K625 Hemorrhage of anus and rectum: Secondary | ICD-10-CM

## 2019-01-28 HISTORY — PX: COLONOSCOPY WITH PROPOFOL: SHX5780

## 2019-01-28 HISTORY — PX: ESOPHAGOGASTRODUODENOSCOPY (EGD) WITH PROPOFOL: SHX5813

## 2019-01-28 LAB — POCT PREGNANCY, URINE: Preg Test, Ur: NEGATIVE

## 2019-01-28 SURGERY — COLONOSCOPY WITH PROPOFOL
Anesthesia: General

## 2019-01-28 MED ORDER — GLYCOPYRROLATE 0.2 MG/ML IJ SOLN
INTRAMUSCULAR | Status: DC | PRN
  Administered 2019-01-28: .2 mg via INTRAVENOUS

## 2019-01-28 MED ORDER — LIDOCAINE HCL (CARDIAC) PF 100 MG/5ML IV SOSY
PREFILLED_SYRINGE | INTRAVENOUS | Status: DC | PRN
  Administered 2019-01-28: 100 mg via INTRATRACHEAL

## 2019-01-28 MED ORDER — PROPOFOL 10 MG/ML IV BOLUS
INTRAVENOUS | Status: DC | PRN
  Administered 2019-01-28: 10 mg via INTRAVENOUS
  Administered 2019-01-28: 60 mg via INTRAVENOUS
  Administered 2019-01-28 (×2): 10 mg via INTRAVENOUS

## 2019-01-28 MED ORDER — PROPOFOL 500 MG/50ML IV EMUL
INTRAVENOUS | Status: DC | PRN
  Administered 2019-01-28: 165 ug/kg/min via INTRAVENOUS

## 2019-01-28 MED ORDER — SODIUM CHLORIDE 0.9 % IV SOLN: INTRAVENOUS | Status: DC

## 2019-01-28 NOTE — Op Note (Signed)
[facility] Gastroenterology Patient Name: Jody Sanders Procedure Date: 01/28/2019 9:00 AM MRN: [id number] Account #: [id number] Date of Birth: 1975-05-14 Admit Type: Inpatient Age: 44 Room: [facility] ENDO ROOM 4 Gender: Female Note Status: Finalized Procedure:             Upper GI endoscopy Indications:           Epigastric abdominal pain, Diarrhea Providers:             Lin Landsman MD, MD Referring MD:          No Local Md, MD (Referring MD) Medicines:             Monitored Anesthesia Care Complications:         No immediate complications. Estimated blood loss: None. Procedure:             Pre-Anesthesia Assessment:                        - Prior to the procedure, a History and Physical was                         performed, and patient medications and allergies were                         reviewed. The patient is competent. The risks and                         benefits of the procedure and the sedation options and                         risks were discussed with the patient. All questions                         were answered and informed consent was obtained.                         Patient identification and proposed procedure were                         verified by the physician, the nurse, the                         anesthesiologist, the anesthetist and the technician                         in the pre-procedure area in the procedure room in the                         endoscopy suite. Mental Status Examination: alert and                         oriented. Airway Examination: normal oropharyngeal                         airway and neck mobility. Respiratory Examination:                         clear to auscultation. CV Examination: normal.  Prophylactic Antibiotics: The patient does not require                         prophylactic antibiotics. Prior Anticoagulants: The                         patient has taken no previous  anticoagulant or                         antiplatelet agents. ASA Grade Assessment: II - A                         patient with mild systemic disease. After reviewing                         the risks and benefits, the patient was deemed in                         satisfactory condition to undergo the procedure. The                         anesthesia plan was to use monitored anesthesia care                         (MAC). Immediately prior to administration of                         medications, the patient was re-assessed for adequacy                         to receive sedatives. The heart rate, respiratory                         rate, oxygen saturations, blood pressure, adequacy of                         pulmonary ventilation, and response to care were                         monitored throughout the procedure. The physical                         status of the patient was re-assessed after the                         procedure.                        After obtaining informed consent, the endoscope was                         passed under direct vision. Throughout the procedure,                         the patient's blood pressure, pulse, and oxygen                         saturations were monitored continuously. The Endoscope  was introduced through the mouth, and advanced to the                         second part of duodenum. The upper GI endoscopy was                         accomplished without difficulty. The patient tolerated                         the procedure well. Findings:      The duodenal bulb and second portion of the duodenum were normal.      The entire examined stomach was normal. Biopsies were taken with a cold       forceps for Helicobacter pylori testing.      The cardia and gastric fundus were normal on retroflexion.      A single 5 mm polypoid lesion with no bleeding was found 20 cm from the       incisors. The polyp was removed with a  cold biopsy forceps. Resection       and retrieval were complete. Impression:            - Normal duodenal bulb and second portion of the                         duodenum.                        - Normal stomach. Biopsied.                        - Esophageal polyp(s) were found. Resected and                         retrieved. Recommendation:        - Await pathology results.                        - Proceed with colonoscopy as scheduled                        See colonoscopy report Procedure Code(s):     --- Professional ---                        [phone], Esophagogastroduodenoscopy, flexible,                         transoral; with biopsy, single or multiple Diagnosis Code(s):     --- Professional ---                        K22.8, Other specified diseases of esophagus                        R10.13, Epigastric pain                        R19.7, Diarrhea, unspecified CPT copyright 2019 American Medical Association. All rights reserved. The codes documented in this report are preliminary and upon coder review may  be revised to meet current compliance requirements. Dr. Ulyess Mort Lin Landsman MD, MD 01/28/2019 10:26:41 AM This report has  been signed electronically. Number of Addenda: 0 Note Initiated On: 01/28/2019 9:00 AM Estimated Blood Loss:  Estimated blood loss: none. Estimated blood loss: none.      [facility]

## 2019-01-28 NOTE — H&P (Signed)
Cephas Darby, MD [address]  [place]  [place], [place] [address]  Main: [phone]  Fax: [phone] Pager: [phone]  Primary Care Physician:  [facility] Primary Gastroenterologist:  Dr. Cephas Darby  Pre-Procedure History & Physical: HPI:  Jody Sanders is a 44 y.o. female is here for an endoscopy and colonoscopy.   History reviewed. No pertinent past medical history.  Past Surgical History:  Procedure Laterality Date  . BREAST SURGERY  2016   Breast Reduction  . WISDOM TOOTH EXTRACTION      Prior to Admission medications   Medication Sig Start Date End Date Taking? Authorizing Provider  hydrocortisone (ANUSOL-HC) 2.5 % rectal cream Place 1 application rectally 2 (two) times daily. 01/13/19   Piscoya, Jacqulyn Bath, MD  Lidocaine, Anorectal, 5 % CREA Apply 1 applicator topically 4 (four) times daily as needed. 01/13/19   Olean Ree, MD    Allergies as of 01/06/2019  . (No Known Allergies)    Family History  Problem Relation Age of Onset  . Diabetes Mother   . Thyroid disease Mother   . COPD Mother     Social History   Socioeconomic History  . Marital status: Single    Spouse name: Not on file  . Number of children: 2  . Years of education: 62  . Highest education level: Not on file  Occupational History  . Occupation: [employer]  Tobacco Use  . Smoking status: Former Smoker    Types: Cigarettes    Quit date: 10/21/2018    Years since quitting: 0.2  . Smokeless tobacco: Never Used  Substance and Sexual Activity  . Alcohol use: Yes  . Drug use: Never  . Sexual activity: Yes    Birth control/protection: Condom  Other Topics Concern  . Not on file  Social History Narrative  . Not on file   Social Determinants of Health   Financial Resource Strain:   . Difficulty of Paying Living Expenses: Not on file  Food Insecurity:   . Worried About Charity fundraiser in the Last Year: Not on file  . Ran Out of Food in  the Last Year: Not on file  Transportation Needs:   . Lack of Transportation (Medical): Not on file  . Lack of Transportation (Non-Medical): Not on file  Physical Activity:   . Days of Exercise per Week: Not on file  . Minutes of Exercise per Session: Not on file  Stress:   . Feeling of Stress : Not on file  Social Connections:   . Frequency of Communication with Friends and Family: Not on file  . Frequency of Social Gatherings with Friends and Family: Not on file  . Attends Religious Services: Not on file  . Active Member of Clubs or Organizations: Not on file  . Attends Archivist Meetings: Not on file  . Marital Status: Not on file  Intimate Partner Violence:   . Fear of Current or Ex-Partner: Not on file  . Emotionally Abused: Not on file  . Physically Abused: Not on file  . Sexually Abused: Not on file    Review of Systems: See HPI, otherwise negative ROS  Physical Exam: BP (!) 126/104   Pulse 65   Temp (!) 97.3 F (36.3 C) (Temporal)   Resp 17   Ht 5\' 3"  (1.6 m)   Wt 78.9 kg   SpO2 100%   BMI 30.82 kg/m  General:   Alert,  pleasant and cooperative  in NAD Head:  Normocephalic and atraumatic. Neck:  Supple; no masses or thyromegaly. Lungs:  Clear throughout to auscultation.    Heart:  Regular rate and rhythm. Abdomen:  Soft, nontender and nondistended. Normal bowel sounds, without guarding, and without rebound.   Neurologic:  Alert and  oriented x4;  grossly normal neurologically.  Impression/Plan: Jody Sanders is here for an endoscopy and colonoscopy to be performed for diarrhea, epigastric pain, rectal bleeding  Risks, benefits, limitations, and alternatives regarding  endoscopy and colonoscopy have been reviewed with the patient.  Questions have been answered.  All parties agreeable.   Sherri Sear, MD  01/28/2019, 10:02 AM

## 2019-01-28 NOTE — Op Note (Signed)
[facility] Gastroenterology Patient Name: Jody Sanders Procedure Date: 01/28/2019 10:06 AM MRN: [id number] Account #: [id number] Date of Birth: Apr 05, 1975 Admit Type: Outpatient Age: 44 Room: [facility] ENDO ROOM 4 Gender: Female Note Status: Finalized Procedure:             Colonoscopy Indications:           Clinically significant diarrhea of unexplained origin,                         Rectal bleeding Providers:             Maryellen Pile MD Referring MD:          No Local Md, MD (Referring MD) Medicines:             Monitored Anesthesia Care Complications:         No immediate complications. Estimated blood loss: None. Procedure:             Pre-Anesthesia Assessment:                        - Prior to the procedure, a History and Physical was                         performed, and patient medications and allergies were                         reviewed. The patient is competent. The risks and                         benefits of the procedure and the sedation options and                         risks were discussed with the patient. All questions                         were answered and informed consent was obtained.                         Patient identification and proposed procedure were                         verified by the physician, the nurse, the                         anesthesiologist, the anesthetist and the technician                         in the pre-procedure area in the procedure room in the                         endoscopy suite. Mental Status Examination: alert and                         oriented. Airway Examination: normal oropharyngeal                         airway and neck mobility. Respiratory Examination:  clear to auscultation. CV Examination: normal.                         Prophylactic Antibiotics: The patient does not require                         prophylactic antibiotics. Prior Anticoagulants: The                          patient has taken no previous anticoagulant or                         antiplatelet agents. ASA Grade Assessment: II - A                         patient with mild systemic disease. After reviewing                         the risks and benefits, the patient was deemed in                         satisfactory condition to undergo the procedure. The                         anesthesia plan was to use monitored anesthesia care                         (MAC). Immediately prior to administration of                         medications, the patient was re-assessed for adequacy                         to receive sedatives. The heart rate, respiratory                         rate, oxygen saturations, blood pressure, adequacy of                         pulmonary ventilation, and response to care were                         monitored throughout the procedure. The physical                         status of the patient was re-assessed after the                         procedure.                        After obtaining informed consent, the colonoscope was                         passed under direct vision. Throughout the procedure,                         the patient's blood pressure, pulse, and oxygen  saturations were monitored continuously. The                         Colonoscope was introduced through the anus and                         advanced to the the terminal ileum, with                         identification of the appendiceal orifice and IC                         valve. The colonoscopy was performed with moderate                         difficulty due to a tortuous colon. Successful                         completion of the procedure was aided by applying                         abdominal pressure. The patient tolerated the                         procedure well. The quality of the bowel preparation                         was evaluated using the BBPS [facility]  Bowel Preparation                         Scale) with scores of: Right Colon = 3, Transverse                         Colon = 3 and Left Colon = 3 (entire mucosa seen well                         with no residual staining, small fragments of stool or                         opaque liquid). The total BBPS score equals 9. Findings:      Skin tags were found on perianal exam.      The terminal ileum appeared normal.      Normal mucosa was found in the entire colon. Biopsies for histology were       taken with a cold forceps from the entire colon for evaluation of       microscopic colitis.      A 12 mm polyp was found in the descending colon. The polyp was       pedunculated. The polyp was removed with a hot snare. Resection and       retrieval were complete. To prevent bleeding after the polypectomy, one       hemostatic clip was successfully placed (MR conditional). There was no       bleeding during, or at the end, of the procedure.      Two sessile polyps were found in the sigmoid colon. The polyps were 5 to       6 mm in size. These polyps  were removed with a cold snare. Resection and       retrieval were complete.      A 7 mm polyp was found in the sigmoid colon. The polyp was       semi-pedunculated. The polyp was removed with a hot snare. Resection and       retrieval were complete.      Non-bleeding external hemorrhoids were found during retroflexion. The       hemorrhoids were medium-sized. Impression:            - Perianal skin tags found on perianal exam.                        - The examined portion of the ileum was normal.                        - Normal mucosa in the entire examined colon. Biopsied.                        - One 12 mm polyp in the descending colon, removed                         with a hot snare. Resected and retrieved. Clip (MR                         conditional) was placed.                        - Two 5 to 6 mm polyps in the sigmoid colon, removed                          with a cold snare. Resected and retrieved.                        - One 7 mm polyp in the sigmoid colon, removed with a                         hot snare. Resected and retrieved.                        - Non-bleeding external hemorrhoids. Recommendation:        - Discharge patient to home (with escort).                        - Resume previous diet today.                        - Continue present medications.                        - Await pathology results.                        - Repeat colonoscopy in 3 years for surveillance of                         multiple polyps.                        - Discussed about hemorrhoid  ligation if symptoms are                         persistent Procedure Code(s):     --- Professional ---                        [phone], Colonoscopy, flexible; with removal of                         tumor(s), polyp(s), or other lesion(s) by snare                         technique                        [id number], 59, Colonoscopy, flexible; with biopsy, single                         or multiple Diagnosis Code(s):     --- Professional ---                        K64.4, Residual hemorrhoidal skin tags                        K63.5, Polyp of colon                        R19.7, Diarrhea, unspecified                        K62.5, Hemorrhage of anus and rectum CPT copyright 2019 American Medical Association. All rights reserved. The codes documented in this report are preliminary and upon coder review may  be revised to meet current compliance requirements. Dr. Ulyess Mort Lin Landsman MD, MD 01/28/2019 11:03:07 AM This report has been signed electronically. Number of Addenda: 0 Note Initiated On: 01/28/2019 10:06 AM Scope Withdrawal Time: 0 hours 24 minutes 47 seconds  Total Procedure Duration: 0 hours 29 minutes 58 seconds  Estimated Blood Loss:  Estimated blood loss: none.      [facility]

## 2019-01-28 NOTE — Anesthesia Preprocedure Evaluation (Signed)
Anesthesia Evaluation  Patient identified by MRN, date of birth, ID band Patient awake    Reviewed: Allergy & Precautions, NPO status , Patient's Chart, lab work & pertinent test results  Airway Mallampati: II       Dental  (+) Teeth Intact   Pulmonary former smoker,    Pulmonary exam normal        Cardiovascular negative cardio ROS Normal cardiovascular exam     Neuro/Psych negative neurological ROS  negative psych ROS   GI/Hepatic Neg liver ROS,   Endo/Other  negative endocrine ROS  Renal/GU negative Renal ROS  negative genitourinary   Musculoskeletal  (+) Arthritis ,   Abdominal Normal abdominal exam  (+)   Peds negative pediatric ROS (+)  Hematology negative hematology ROS (+)   Anesthesia Other Findings History reviewed. No pertinent past medical history.  Rectal bleeding.  Reproductive/Obstetrics                             Anesthesia Physical Anesthesia Plan  ASA: II  Anesthesia Plan: General   Post-op Pain Management:    Induction: Intravenous  PONV Risk Score and Plan: Propofol infusion  Airway Management Planned: Nasal Cannula  Additional Equipment:   Intra-op Plan:   Post-operative Plan:   Informed Consent: I have reviewed the patients History and Physical, chart, labs and discussed the procedure including the risks, benefits and alternatives for the proposed anesthesia with the patient or authorized representative who has indicated his/her understanding and acceptance.     Dental advisory given  Plan Discussed with: CRNA and Surgeon  Anesthesia Plan Comments:         Anesthesia Quick Evaluation

## 2019-01-28 NOTE — Transfer of Care (Signed)
Immediate Anesthesia Transfer of Care Note  Patient: Jody Sanders  Procedure(s) Performed: COLONOSCOPY WITH PROPOFOL (N/A ) ESOPHAGOGASTRODUODENOSCOPY (EGD) WITH PROPOFOL (N/A )  Patient Location: Endoscopy Unit  Anesthesia Type:General  Level of Consciousness: awake, drowsy and patient cooperative  Airway & Oxygen Therapy: Patient Spontanous Breathing  Post-op Assessment: Report given to RN and Post -op Vital signs reviewed and stable  Post vital signs: Reviewed and stable  Last Vitals:  Vitals Value Taken Time  BP 114/75 01/28/19 1104  Temp    Pulse 89 01/28/19 1105  Resp 16 01/28/19 1105  SpO2 100 % 01/28/19 1105  Vitals shown include unvalidated device data.  Last Pain:  Vitals:   01/28/19 1102  TempSrc: Temporal  PainSc: 0-No pain         Complications: No apparent anesthesia complications

## 2019-01-29 ENCOUNTER — Encounter: Payer: Self-pay | Admitting: Gastroenterology

## 2019-01-29 LAB — SURGICAL PATHOLOGY

## 2019-01-29 NOTE — Anesthesia Postprocedure Evaluation (Signed)
Anesthesia Post Note  Patient: Jody Sanders  Procedure(s) Performed: COLONOSCOPY WITH PROPOFOL (N/A ) ESOPHAGOGASTRODUODENOSCOPY (EGD) WITH PROPOFOL (N/A )  Patient location during evaluation: Endoscopy Anesthesia Type: General Level of consciousness: awake and alert and oriented Pain management: pain level controlled Vital Signs Assessment: post-procedure vital signs reviewed and stable Respiratory status: spontaneous breathing Cardiovascular status: blood pressure returned to baseline Anesthetic complications: no     Last Vitals:  Vitals:   01/28/19 1122 01/28/19 1132  BP: 105/62 119/67  Pulse:    Resp:    Temp:    SpO2:      Last Pain:  Vitals:   01/28/19 1122  TempSrc:   PainSc: 0-No pain                 Darionna Banke

## 2019-02-08 ENCOUNTER — Other Ambulatory Visit: Payer: Self-pay

## 2019-02-16 ENCOUNTER — Ambulatory Visit (INDEPENDENT_AMBULATORY_CARE_PROVIDER_SITE_OTHER): Payer: Medicaid Other | Admitting: Gastroenterology

## 2019-02-16 ENCOUNTER — Encounter: Payer: Self-pay | Admitting: Gastroenterology

## 2019-02-16 DIAGNOSIS — R1030 Lower abdominal pain, unspecified: Secondary | ICD-10-CM | POA: Diagnosis not present

## 2019-02-16 MED ORDER — DICYCLOMINE HCL 10 MG PO CAPS: 10.0000 mg | ORAL_CAPSULE | Freq: Three times a day (TID) | ORAL | 0 refills | Status: DC

## 2019-02-16 NOTE — Progress Notes (Signed)
Jody Sear, MD [address]  [place]  [place], [place] [address]  Main: [phone]  Fax: [phone]    Gastroenterology Consultation Video Visit  Referring Provider:     [facility] [place] Physician:  [facility] [place] Primary Gastroenterologist:  Dr. Bonna Gains Reason for Consultation: Lower abdominal pain        HPI:   Jody Sanders is a 44 y.o. female referred by Dr. Domingo Madeira, [place]  for consultation & management of lower abdominal pain  Virtual Visit Video Note  I connected with Jody Sanders on 02/16/19 at  3:00 PM EST by video and verified that I am speaking with the correct person using two identifiers.   I discussed the limitations, risks, security and privacy concerns of performing an evaluation and management service by video and the availability of in person appointments. I also discussed with the patient that there may be a patient responsible charge related to this service. The patient expressed understanding and agreed to proceed.  Location of the Patient: Work  Location of the provider: Work  Persons participating in the visit: Patient and provider only   History of Present Illness: Ms. Saucer has seen Dr. Bonna Gains in 12/2018 for diarrhea, epigastric pain.  Subsequently, she underwent EGD and colonoscopy by me which were unremarkable to explain her symptoms.  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  Patient reports that her diarrhea has resolved since then.  However, she started experiencing mild lower abdominal cramps after the procedure.  When she went back to work which involves lifting heavy boxes, she felt her abdominal cramps were getting worse.  Therefore, she called my office requesting work excuse to avoid lifting heavy weights.  Virtual visit was arranged.  Patient denies any other symptoms other than abdominal cramps below the umbilicus, with no particular relation to  food.  She denies abdominal bloating as well.  She denies nausea, vomiting.  NSAIDs: None  Antiplts/Anticoagulants/Anti thrombotics: None  GI Procedures:  EGD and colonoscopy 01/28/2019 - Normal duodenal bulb and second portion of the duodenum. - Normal stomach. Biopsied. - Esophageal polyp(s) were found. Resected and retrieved.  - Perianal skin tags found on perianal exam. - The examined portion of the ileum was normal. - Normal mucosa in the entire examined colon. Biopsied. - One 12 mm polyp in the descending colon, removed with a hot snare. Resected and retrieved. Clip (MR conditional) was placed. - Two 5 to 6 mm polyps in the sigmoid colon, removed with a cold snare. Resected and retrieved. - One 7 mm polyp in the sigmoid colon, removed with a hot snare. Resected and retrieved. - Non-bleeding external hemorrhoids.  DIAGNOSIS:  A. STOMACH; RANDOM COLD BIOPSY:  - ANTRAL AND OXYNTIC MUCOSA WITHOUT PATHOLOGIC CHANGES.  - NEGATIVE FOR H. PYLORI, INTESTINAL METAPLASIA, DYSPLASIA, AND  MALIGNANCY.   B. ESOPHAGUS LESION, UPPER; COLD BIOPSY:  - SQUAMOUS PAPILLOMA.  - NEGATIVE FOR DYSPLASIA AND MALIGNANCY.   C. COLON; RANDOM COLD BIOPSY:  - COLONIC MUCOSA WITH INTACT CRYPT ARCHITECTURE.  - NEGATIVE FOR MICROSCOPIC COLITIS AND DYSPLASIA.   D. COLON POLYP, DESCENDING; HOT SNARE:  - TUBULAR ADENOMA ON 5 MM PEDICLE; MARGINS ARE FREE OF ADENOMA.  - NEGATIVE FOR HIGH-GRADE DYSPLASIA AND MALIGNANCY.   E. COLON POLYPS X 3, SIGMOID; COLD SNARE (2), HOT SNARE (1):  - TUBULAR ADENOMA, NEGATIVE FOR HIGH-GRADE DYSPLASIA AND MALIGNANCY.  - HYPERPLASTIC POLYPS, 3 FRAGMENTS, NEGATIVE FOR DYSPLASIA AND  MALIGNANCY.   No past medical history on file.  Past Surgical History:  Procedure Laterality Date  . BREAST SURGERY  2016   Breast Reduction  . COLONOSCOPY WITH PROPOFOL N/A 01/28/2019   Procedure: COLONOSCOPY WITH PROPOFOL;  Surgeon: Lin Landsman, MD;  Location: [facility] ENDOSCOPY;   Service: Gastroenterology;  Laterality: N/A;  . ESOPHAGOGASTRODUODENOSCOPY (EGD) WITH PROPOFOL N/A 01/28/2019   Procedure: ESOPHAGOGASTRODUODENOSCOPY (EGD) WITH PROPOFOL;  Surgeon: Lin Landsman, MD;  Location: [facility] ENDOSCOPY;  Service: Gastroenterology;  Laterality: N/A;  . WISDOM TOOTH EXTRACTION       Current Outpatient Medications:  .  hydrocortisone (ANUSOL-HC) 2.5 % rectal cream, Place 1 application rectally 2 (two) times daily., Disp: 30 g, Rfl: 0 .  Lidocaine, Anorectal, 5 % CREA, Apply 1 applicator topically 4 (four) times daily as needed., Disp: 45 g, Rfl: 0 .  dicyclomine (BENTYL) 10 MG capsule, Take 1 capsule (10 mg total) by mouth 4 (four) times daily -  before meals and at bedtime for 30 doses., Disp: 30 capsule, Rfl: 0   Family History  Problem Relation Age of Onset  . Diabetes Mother   . Thyroid disease Mother   . COPD Mother      Social History   Tobacco Use  . Smoking status: Former Smoker    Types: Cigarettes    Quit date: 10/21/2018    Years since quitting: 0.3  . Smokeless tobacco: Never Used  Substance Use Topics  . Alcohol use: Yes  . Drug use: Never    Allergies as of 02/16/2019  . (No Known Allergies)     Imaging Studies: Reviewed  Assessment and Plan:   Jody Sanders is a 44 y.o. female is connected via video visit for lower abdominal cramps.  Patient underwent bidirectional endoscopy which is unremarkable except for tubular adenomas of the colon.  She is not experiencing any other GI symptoms.  I have advised her to try Bentyl as needed which she is willing to try.  I have written her a work note to excuse her from lifting heavy boxes for the next 2 weeks   Follow Up Instructions:   I discussed the assessment and treatment plan with the patient. The patient was provided an opportunity to ask questions and all were answered. The patient agreed with the plan and demonstrated an understanding of the instructions.   The patient was  advised to call back or seek an in-person evaluation if the symptoms worsen or if the condition fails to improve as anticipated.  I provided 13 minutes of face-to-face time during this encounter.   Follow up as needed   Cephas Darby, MD

## 2019-03-09 ENCOUNTER — Other Ambulatory Visit: Payer: Self-pay | Admitting: Surgery

## 2019-03-09 MED ORDER — HYDROCORTISONE (PERIANAL) 2.5 % EX CREA: 1.0000 "application " | TOPICAL_CREAM | Freq: Two times a day (BID) | CUTANEOUS | 0 refills | Status: DC

## 2019-03-27 ENCOUNTER — Ambulatory Visit: Payer: Medicaid Other

## 2019-06-11 ENCOUNTER — Other Ambulatory Visit (HOSPITAL_COMMUNITY)
Admission: RE | Admit: 2019-06-11 | Discharge: 2019-06-11 | Disposition: A | Discharge status: Home / Self Care | Payer: Medicaid Other | Source: Ambulatory Visit | Attending: Obstetrics and Gynecology | Admitting: Obstetrics and Gynecology

## 2019-06-11 ENCOUNTER — Ambulatory Visit (INDEPENDENT_AMBULATORY_CARE_PROVIDER_SITE_OTHER): Payer: Medicaid Other | Admitting: Obstetrics and Gynecology

## 2019-06-11 ENCOUNTER — Encounter: Payer: Self-pay | Admitting: Obstetrics and Gynecology

## 2019-06-11 ENCOUNTER — Other Ambulatory Visit: Payer: Self-pay

## 2019-06-11 VITALS — BP 122/68 | HR 71 | Wt 174.0 lb

## 2019-06-11 DIAGNOSIS — Z308 Encounter for other contraceptive management: Secondary | ICD-10-CM | POA: Diagnosis not present

## 2019-06-11 DIAGNOSIS — Z113 Encounter for screening for infections with a predominantly sexual mode of transmission: Secondary | ICD-10-CM | POA: Diagnosis not present

## 2019-06-11 NOTE — Progress Notes (Signed)
Gynecology STD Evaluation   hChief Complaint:  Chief Complaint  Patient presents with  . Gynecologic Exam    STD testing, follow up from recent TAB in April    History of Present Illness: Patient is a 44 y.o. [id number] presents for STD testing.  The patient has noted intermenstrual spotting,  has not experienced postcoital bleeding, and does report increased vaginal discharge.  There is not a history of prior sexually transmitted infection(s).     The patient is sexually active. She currently uses None for contraception.  No specific exposures EAB via D&C in April  Review of Systems: 10 point review of systems negative unless otherwise noted in HPI  Past Medical History:  History reviewed. No pertinent past medical history.  Past Surgical History:  Past Surgical History:  Procedure Laterality Date  . BREAST SURGERY  2016   Breast Reduction  . COLONOSCOPY WITH PROPOFOL N/A 01/28/2019   Procedure: COLONOSCOPY WITH PROPOFOL;  Surgeon: Lin Landsman, MD;  Location: [facility] ENDOSCOPY;  Service: Gastroenterology;  Laterality: N/A;  . ESOPHAGOGASTRODUODENOSCOPY (EGD) WITH PROPOFOL N/A 01/28/2019   Procedure: ESOPHAGOGASTRODUODENOSCOPY (EGD) WITH PROPOFOL;  Surgeon: Lin Landsman, MD;  Location: [facility] ENDOSCOPY;  Service: Gastroenterology;  Laterality: N/A;  . WISDOM TOOTH EXTRACTION      Gynecologic History: No LMP recorded. (Menstrual status: Other).  Obstetric History: [id number]  Family History:  Family History  Problem Relation Age of Onset  . Diabetes Mother   . Thyroid disease Mother   . COPD Mother     Social History:  Social History   Socioeconomic History  . Marital status: Single    Spouse name: Not on file  . Number of children: 2  . Years of education: 18  . Highest education level: Not on file  Occupational History  . Occupation: [employer]  Tobacco Use  . Smoking status: Former Smoker    Types: Cigarettes    Quit date: 10/21/2018    Years since  quitting: 0.6  . Smokeless tobacco: Never Used  Substance and Sexual Activity  . Alcohol use: Yes  . Drug use: Never  . Sexual activity: Yes    Birth control/protection: Condom  Other Topics Concern  . Not on file  Social History Narrative  . Not on file   Social Determinants of Health   Financial Resource Strain:   . Difficulty of Paying Living Expenses:   Food Insecurity:   . Worried About Charity fundraiser in the Last Year:   . Arboriculturist in the Last Year:   Transportation Needs:   . Film/video editor (Medical):   Marland Kitchen Lack of Transportation (Non-Medical):   Physical Activity:   . Days of Exercise per Week:   . Minutes of Exercise per Session:   Stress:   . Feeling of Stress :   Social Connections:   . Frequency of Communication with Friends and Family:   . Frequency of Social Gatherings with Friends and Family:   . Attends Religious Services:   . Active Member of Clubs or Organizations:   . Attends Archivist Meetings:   Marland Kitchen Marital Status:   Intimate Partner Violence:   . Fear of Current or Ex-Partner:   . Emotionally Abused:   Marland Kitchen Physically Abused:   . Sexually Abused:     Allergies:  No Known Allergies  Medications: Prior to Admission medications   Medication Sig Start Date End Date Taking? Authorizing Provider  Lidocaine, Anorectal, 5 % CREA Apply  1 applicator topically 4 (four) times daily as needed. 01/13/19  Yes PiscoyaJacqulyn Bath, MD    Physical Exam Blood pressure 122/68, pulse 71, weight 174 lb (78.9 kg). No LMP recorded. (Menstrual status: Other).  General: NAD, well nourished, appears stated age 45: normocephalic, anicteric Pulmonary: No increased work of breathing, CTAB Genitourinary:  External: Normal external female genitalia.  Normal urethral meatus, normal  Bartholin's and Skene's glands.    Vagina: Normal vaginal mucosa, no evidence of prolapse.    Cervix: Grossly normal in appearance, no bleeding  Uterus: Non-enlarged,  mobile, normal contour.  No CMT  Adnexa: ovaries non-enlarged, no adnexal masses  Rectal: deferred  Lymphatic: no evidence of inguinal lymphadenopathy Extremities: no edema, erythema, or tenderness Neurologic: Grossly intact Psychiatric: mood appropriate, affect full  Female chaperone present for pelvic and breast  portions of the physical exam  Assessment: 44 y.o. [id number] presenting for STD screening Plan: Problem List Items Addressed This Visit    None    Visit Diagnoses    Routine screening for STI (sexually transmitted infection)    -  Primary   Relevant Orders   Cervicovaginal ancillary only   HEP, RPR, HIV Panel       1) Contraception - Education given regarding options for contraception, including barrier methods, injectable contraception, IUD placement, tubal ligation.  We discussed that contraception, while reliable in preventing unintended pregnancy does not mitigate her risk of STI.  We discussed safe sex practices to reduce her furture risk of STI's.  Patient is interested in possible BTL.  2) Full STI screening was offered accepted  3) Interested in possible ablation which was discussed today.  If interested in ablation and tubal ligation to call office.  Also discussed LARCS as contraceptive option  4) Return in about 1 year (around 06/10/2020) for annual.   Malachy Mood, MD, [place], [facility] 06/11/2019, 8:54 AM

## 2019-06-11 NOTE — Patient Instructions (Addendum)
Endometrial Ablation (NOVASURE) Endometrial ablation is a procedure that destroys the thin inner layer of the lining of the uterus (endometrium). This procedure may be done:  To stop heavy periods.  To stop bleeding that is causing anemia.  To control irregular bleeding.  To treat bleeding caused by small tumors (fibroids) in the endometrium. This procedure is often an alternative to major surgery, such as removal of the uterus and cervix (hysterectomy). As a result of this procedure:  You may not be able to have children. However, if you are premenopausal (you have not gone through menopause): ? You may still have a small chance of getting pregnant. ? You will need to use a reliable method of birth control after the procedure to prevent pregnancy.  You may stop having a menstrual period, or you may have only a small amount of bleeding during your period. Menstruation may return several years after the procedure. Tell a health care provider about:  Any allergies you have.  All medicines you are taking, including vitamins, herbs, eye drops, creams, and over-the-counter medicines.  Any problems you or family members have had with the use of anesthetic medicines.  Any blood disorders you have.  Any surgeries you have had.  Any medical conditions you have. What are the risks? Generally, this is a safe procedure. However, problems may occur, including:  A hole (perforation) in the uterus or bowel.  Infection of the uterus, bladder, or vagina.  Bleeding.  Damage to other structures or organs.  An air bubble in the lung (air embolus).  Problems with pregnancy after the procedure.  Failure of the procedure.  Decreased ability to diagnose cancer in the endometrium. What happens before the procedure?  You will have tests of your endometrium to make sure there are no pre-cancerous cells or cancer cells present.  You may have an ultrasound of the uterus.  You may be given  medicines to thin the endometrium.  Ask your health care provider about: ? Changing or stopping your regular medicines. This is especially important if you take diabetes medicines or blood thinners. ? Taking medicines such as aspirin and ibuprofen. These medicines can thin your blood. Do not take these medicines before your procedure if your doctor tells you not to.  Plan to have someone take you home from the hospital or clinic. What happens during the procedure?   You will lie on an exam table with your feet and legs supported as in a pelvic exam.  To lower your risk of infection: ? Your health care team will wash or sanitize their hands and put on germ-free (sterile) gloves. ? Your genital area will be washed with soap.  An IV tube will be inserted into one of your veins.  You will be given a medicine to help you relax (sedative).  A surgical instrument with a light and camera (resectoscope) will be inserted into your vagina and moved into your uterus. This allows your surgeon to see inside your uterus.  Endometrial tissue will be removed using one of the following methods: ? Radiofrequency. This method uses a radiofrequency-alternating electric current to remove the endometrium. ? Cryotherapy. This method uses extreme cold to freeze the endometrium. ? Heated-free liquid. This method uses a heated saltwater (saline) solution to remove the endometrium. ? Microwave. This method uses high-energy microwaves to heat up the endometrium and remove it. ? Thermal balloon. This method involves inserting a catheter with a balloon tip into the uterus. The balloon tip is filled  with heated fluid to remove the endometrium. The procedure may vary among health care providers and hospitals. What happens after the procedure?  Your blood pressure, heart rate, breathing rate, and blood oxygen level will be monitored until the medicines you were given have worn off.  As tissue healing occurs, you may  notice vaginal bleeding for 4-6 weeks after the procedure. You may also experience: ? Cramps. ? Thin, watery vaginal discharge that is light pink or brown in color. ? A need to urinate more frequently than usual. ? Nausea.  Do not drive for 24 hours if you were given a sedative.  Do not have sex or insert anything into your vagina until your health care provider approves. Summary  Endometrial ablation is done to treat the many causes of heavy menstrual bleeding.  The procedure may be done only after medications have been tried to control the bleeding.  Plan to have someone take you home from the hospital or clinic. This information is not intended to replace advice given to you by your health care provider. Make sure you discuss any questions you have with your health care provider. Document Revised: 06/17/2017 Document Reviewed: 01/18/2016 Elsevier Patient Education  [place].

## 2019-06-12 LAB — HEP, RPR, HIV PANEL
HIV Screen 4th Generation wRfx: NONREACTIVE
Hepatitis B Surface Ag: NEGATIVE
RPR Ser Ql: NONREACTIVE

## 2019-06-16 LAB — CERVICOVAGINAL ANCILLARY ONLY
Chlamydia: NEGATIVE
Comment: NEGATIVE
Comment: NEGATIVE
Comment: NORMAL
Neisseria Gonorrhea: NEGATIVE
Trichomonas: NEGATIVE

## 2019-12-16 IMAGING — DX DG ANKLE COMPLETE 3+V*L*
3 series · 3 of 3 positions shown · non-contrast
Comparison: None.

CLINICAL DATA: Onset of plantar and lateral ankle pain yesterday.
The patient is able to bear weight. No definite injury.

EXAM:
LEFT ANKLE COMPLETE - 3+ VIEW

[ankle ap]
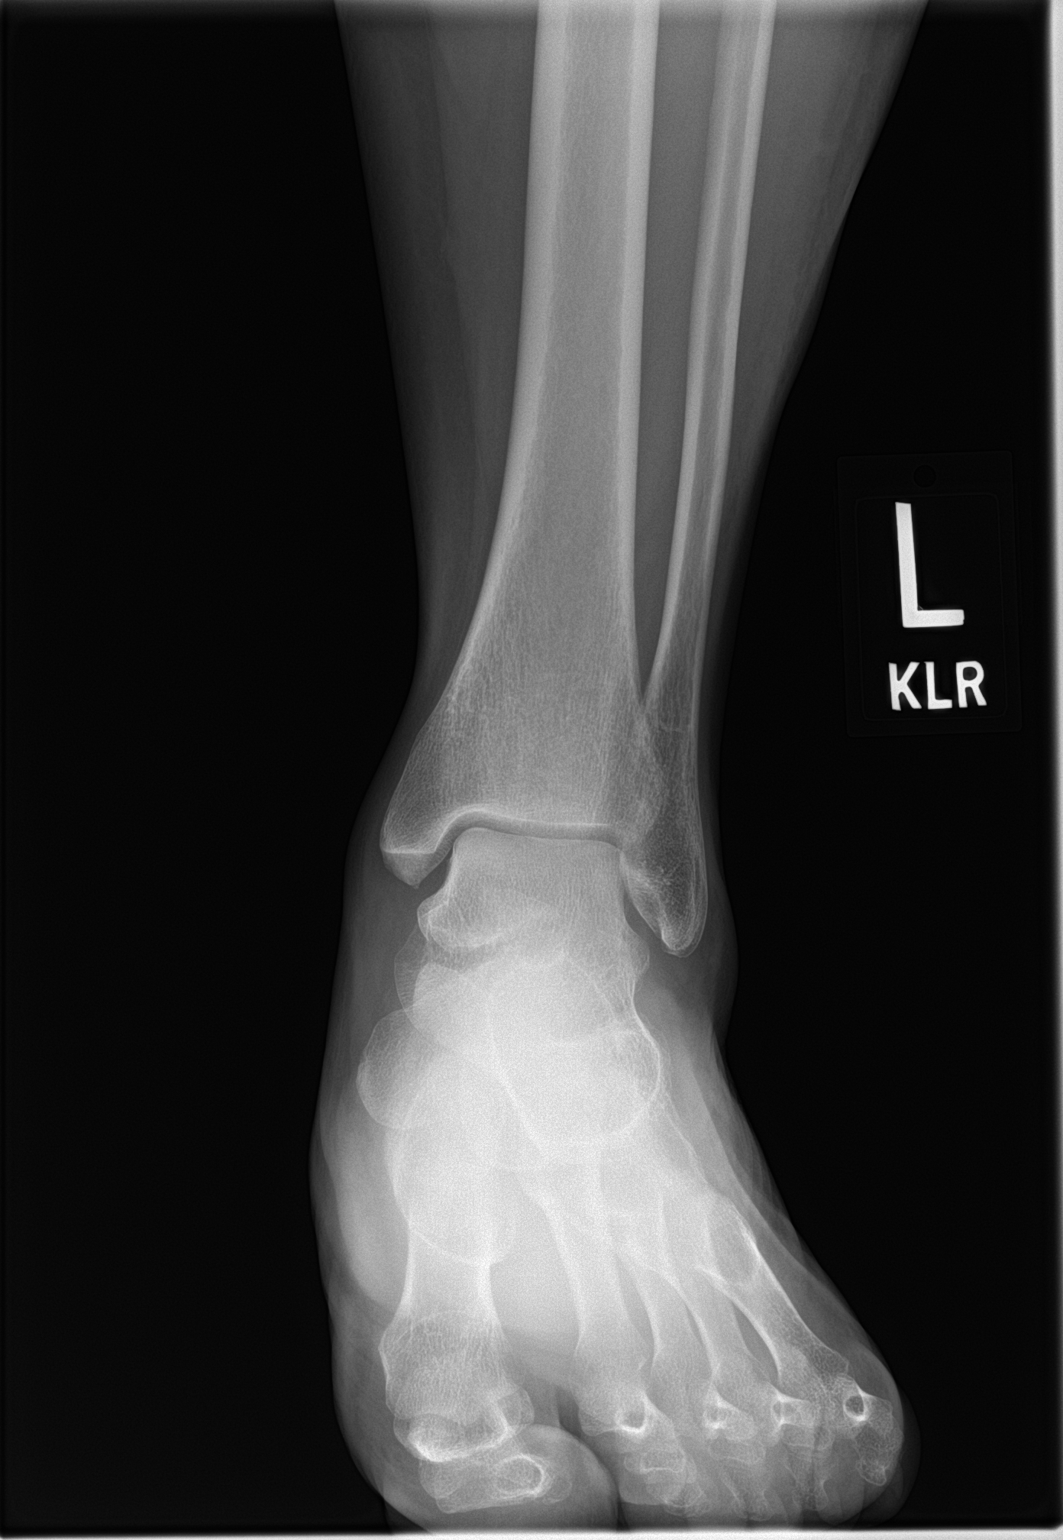

[ankle obl]
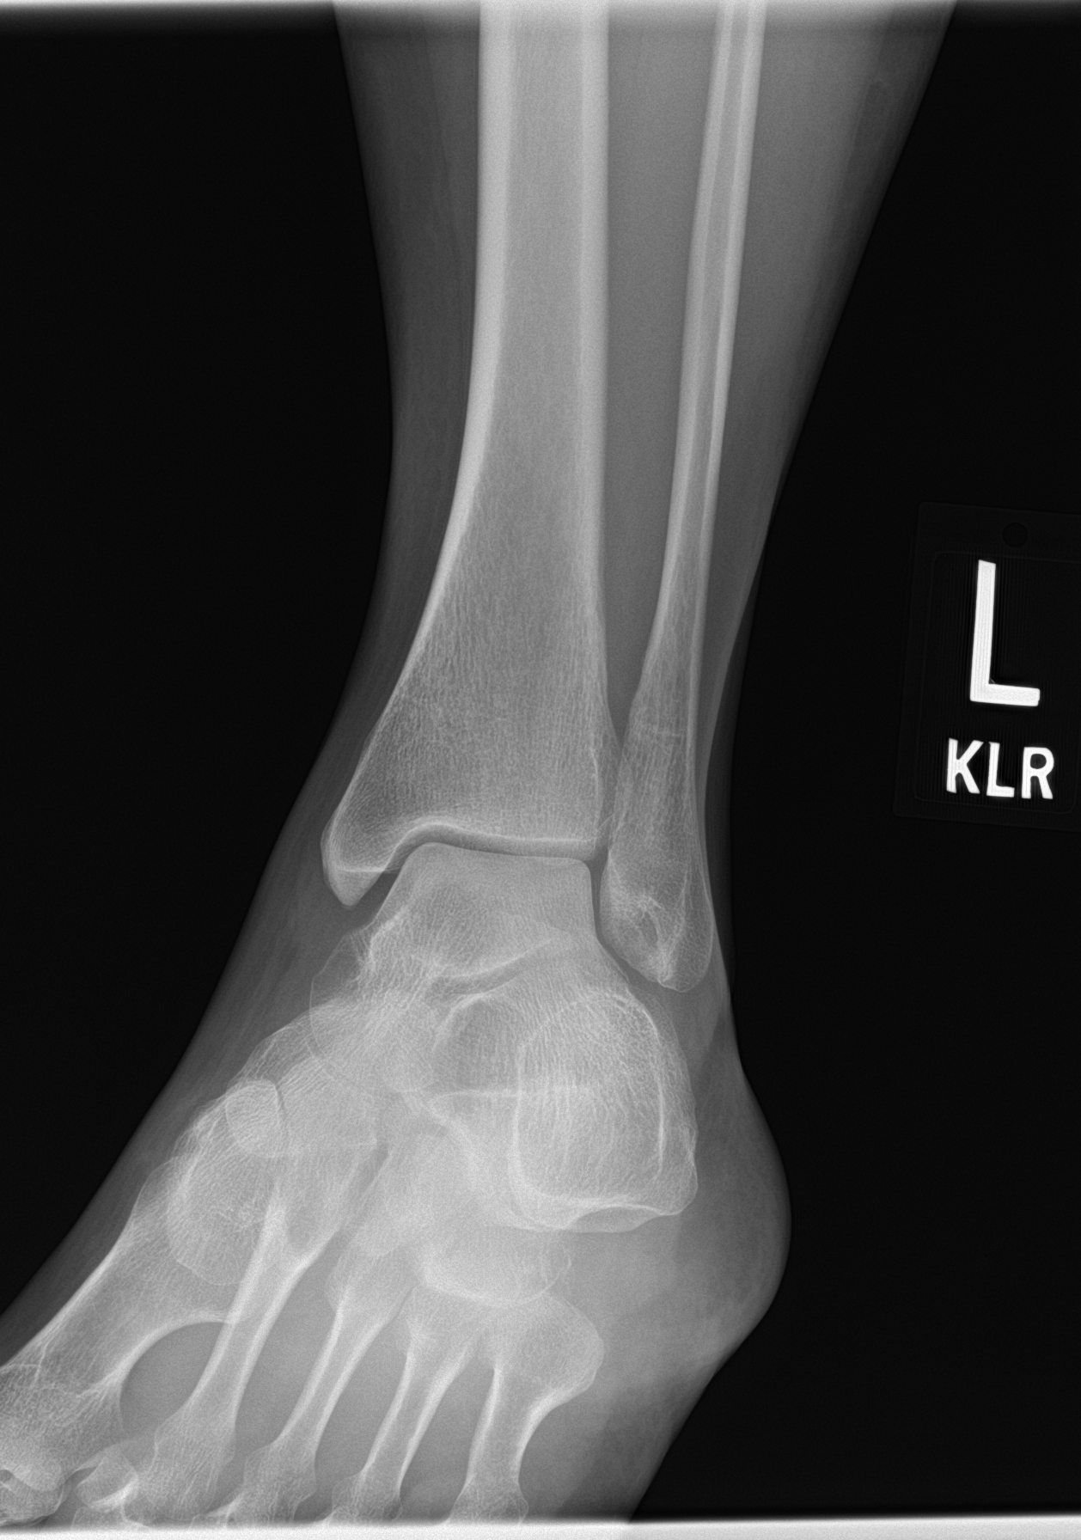

[ankle lat]
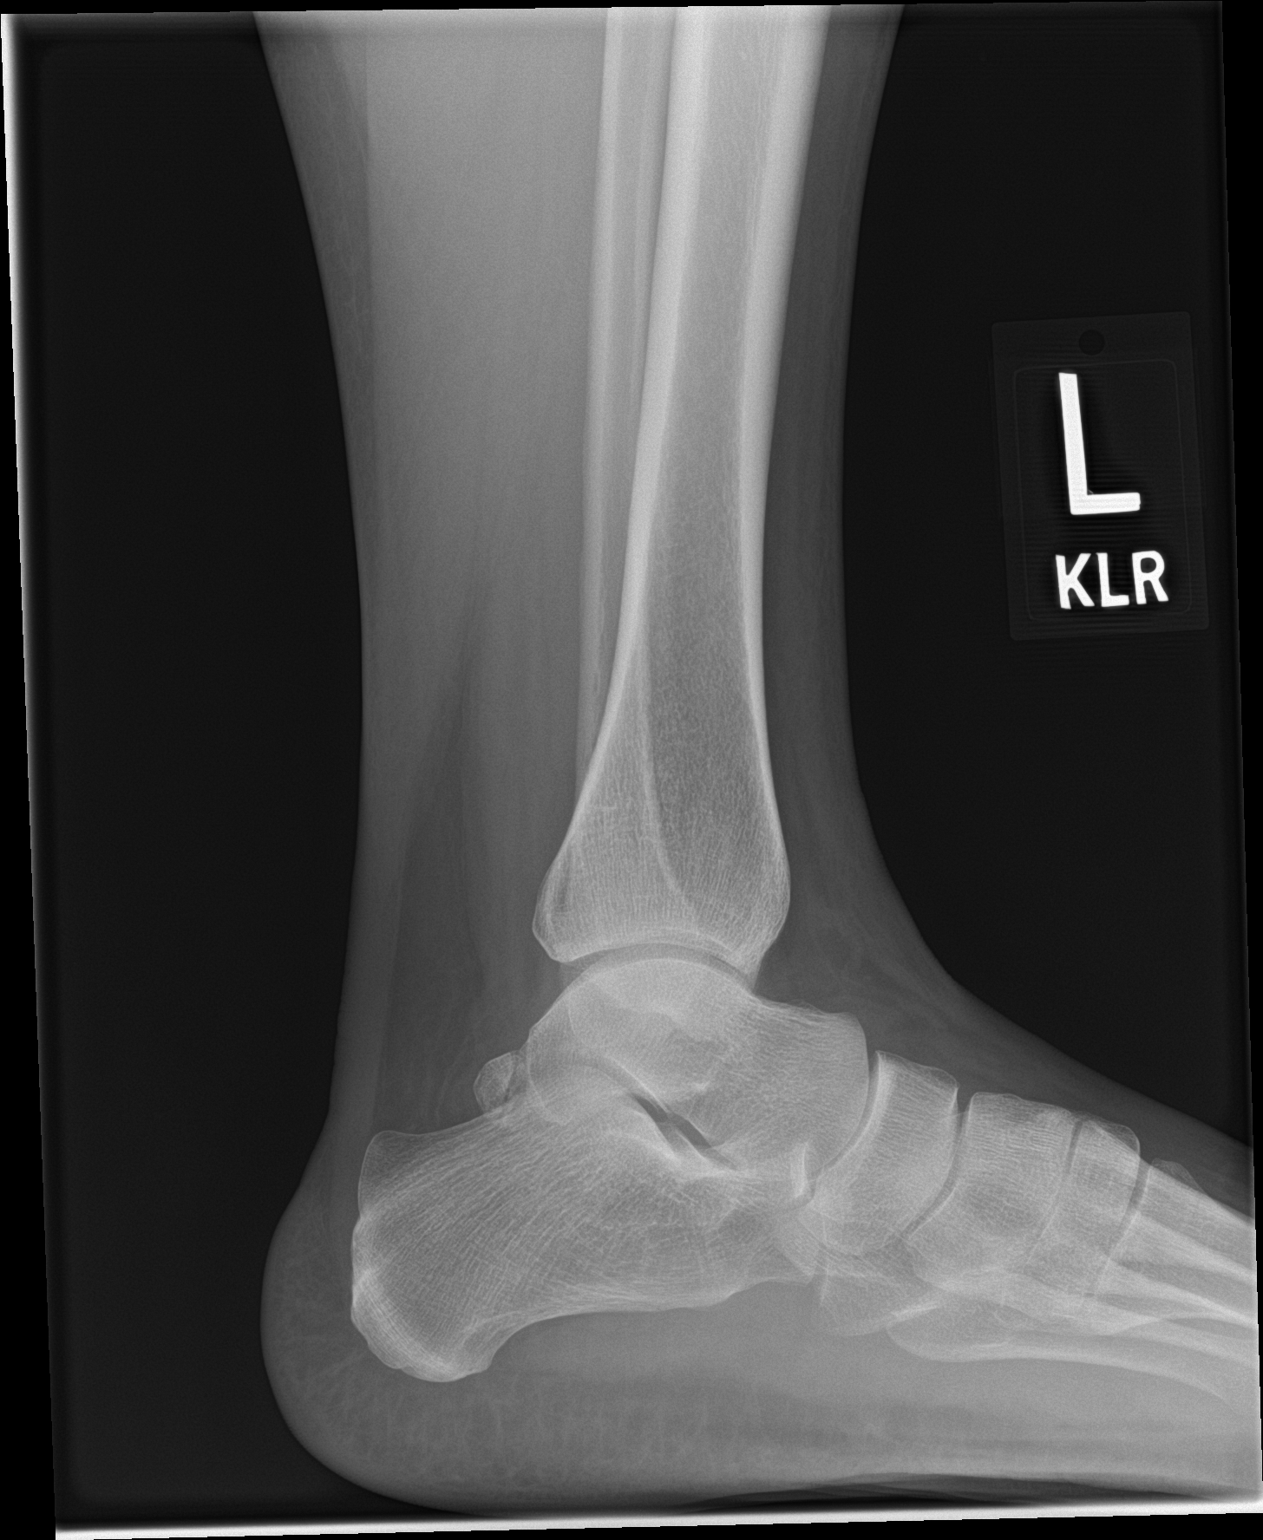

[3 of 3 positions shown; findings below may reference images not displayed]

FINDINGS: The bones are subjectively adequately mineralized. There is no acute
fracture or dislocation. There is no lytic nor blastic lesion. The
ankle joint mortise is preserved. The talar dome is intact. Minimal
soft tissue swelling is present over the malleolar regions.
IMPRESSION: There is no acute bony abnormality of the left ankle.

## 2020-06-06 ENCOUNTER — Encounter: Payer: Self-pay | Admitting: Obstetrics and Gynecology

## 2020-06-06 ENCOUNTER — Ambulatory Visit (INDEPENDENT_AMBULATORY_CARE_PROVIDER_SITE_OTHER): Payer: Medicaid Other | Admitting: Obstetrics and Gynecology

## 2020-06-06 ENCOUNTER — Other Ambulatory Visit: Payer: Self-pay | Admitting: Surgery

## 2020-06-06 ENCOUNTER — Other Ambulatory Visit (HOSPITAL_COMMUNITY)
Admission: RE | Admit: 2020-06-06 | Discharge: 2020-06-06 | Disposition: A | Discharge status: Home / Self Care | Payer: Medicaid Other | Source: Ambulatory Visit | Attending: Obstetrics and Gynecology | Admitting: Obstetrics and Gynecology

## 2020-06-06 ENCOUNTER — Other Ambulatory Visit: Payer: Self-pay

## 2020-06-06 VITALS — BP 120/72 | Ht 63.0 in | Wt 168.0 lb

## 2020-06-06 DIAGNOSIS — Z202 Contact with and (suspected) exposure to infections with a predominantly sexual mode of transmission: Secondary | ICD-10-CM | POA: Insufficient documentation

## 2020-06-06 DIAGNOSIS — Z113 Encounter for screening for infections with a predominantly sexual mode of transmission: Secondary | ICD-10-CM

## 2020-06-06 DIAGNOSIS — Z1239 Encounter for other screening for malignant neoplasm of breast: Secondary | ICD-10-CM

## 2020-06-06 DIAGNOSIS — Z01419 Encounter for gynecological examination (general) (routine) without abnormal findings: Secondary | ICD-10-CM | POA: Diagnosis not present

## 2020-06-06 DIAGNOSIS — N939 Abnormal uterine and vaginal bleeding, unspecified: Secondary | ICD-10-CM

## 2020-06-06 NOTE — Progress Notes (Signed)
Gynecology Annual Exam  PCP: [facility] [place]  Chief Complaint:  Chief Complaint  Patient presents with  . Gynecologic Exam    Annual - no concerns. RM 5    History of Present Illness: Patient is a 45 y.o. [id number] presents for annual exam. The patient has no complaints today.   LMP: Patient's last menstrual period was 05/23/2020 (approximate). Average Interval: 28-30 days Duration of flow: 5 days Heavy Menses: no Clots: no Intermenstrual Bleeding: Yes spotting Postcoital Bleeding: no Dysmenorrhea: no   The patient is not sexually active. She currently uses condoms for contraception. She denies dyspareunia.  The patient does perform self breast exams.  There is no notable family history of breast or ovarian cancer in her family.  The patient wears seatbelts: yes.   The patient has regular exercise: not asked.    The patient denies current symptoms of depression.    Review of Systems: Review of Systems  Constitutional: Negative for chills and fever.  HENT: Negative for congestion.   Respiratory: Negative for cough and shortness of breath.   Cardiovascular: Negative for chest pain and palpitations.  Gastrointestinal: Negative for abdominal pain, constipation, diarrhea, heartburn, nausea and vomiting.  Genitourinary: Negative for dysuria, frequency and urgency.  Skin: Negative for itching and rash.  Neurological: Negative for dizziness and headaches.  Endo/Heme/Allergies: Negative for polydipsia.  Psychiatric/Behavioral: Negative for depression.    Past Medical History:  Patient Active Problem List   Diagnosis Date Noted  . 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 BREAST SURGERY  2016   Breast Reduction  . COLONOSCOPY WITH PROPOFOL N/A 01/28/2019   Procedure: COLONOSCOPY WITH PROPOFOL;  Surgeon: Lin Landsman, MD;  Location: [facility] ENDOSCOPY;  Service: Gastroenterology;   Laterality: N/A;  . ESOPHAGOGASTRODUODENOSCOPY (EGD) WITH PROPOFOL N/A 01/28/2019   Procedure: ESOPHAGOGASTRODUODENOSCOPY (EGD) WITH PROPOFOL;  Surgeon: Lin Landsman, MD;  Location: [facility] ENDOSCOPY;  Service: Gastroenterology;  Laterality: N/A;  . WISDOM TOOTH EXTRACTION      Gynecologic History:  Patient's last menstrual period was 05/23/2020 (approximate). Contraception: condoms Last Pap: Results were: 11/04/2018 NIL and HR HPV negative    Obstetric History: [id number]  Family History:  Family History  Problem Relation Age of Onset  . Diabetes Mother   . Thyroid disease Mother   . COPD Mother   . Dementia Mother     Social History:  Social History   Socioeconomic History  . Marital status: Single    Spouse name: Not on file  . Number of children: 2  . Years of education: 57  . Highest education level: Not on file  Occupational History  . Occupation: [employer]  Tobacco Use  . Smoking status: Former Smoker    Types: Cigarettes    Quit date: 10/21/2018    Years since quitting: 1.6  . Smokeless tobacco: Never Used  Vaping Use  . Vaping Use: Never used  Substance and Sexual Activity  . Alcohol use: Yes  . Drug use: Never  . Sexual activity: Yes    Birth control/protection: None  Other Topics Concern  . Not on file  Social History Narrative  . Not on file   Social Determinants of Health   Financial Resource Strain: Not on file  Food Insecurity: Not on file  Transportation Needs: Not on file  Physical Activity: Not on file  Stress: Not on file  Social Connections: Not on file  Intimate Partner Violence: Not on file    Allergies:  No Known Allergies  Medications: Prior to Admission medications   Medication Sig Start Date End Date Taking? Authorizing Provider  Lidocaine, Anorectal, 5 % CREA Apply 1 applicator topically 4 (four) times daily as needed. 01/13/19   Olean Ree, MD    Physical Exam Vitals: Blood pressure 120/72, height 5\' 3"  (1.6 m), weight  168 lb (76.2 kg), last menstrual period 05/23/2020.  General: NAD HEENT: normocephalic, anicteric Thyroid: no enlargement, no palpable nodules Pulmonary: No increased work of breathing, CTAB Cardiovascular: RRR, distal pulses 2+ Breast: Breast symmetrical, no tenderness, no palpable nodules or masses, no skin or nipple retraction present, no nipple discharge.  No axillary or supraclavicular lymphadenopathy. Abdomen: NABS, soft, non-tender, non-distended.  Umbilicus without lesions.  No hepatomegaly, splenomegaly or masses palpable. No evidence of hernia  Genitourinary:  External: Normal external female genitalia.  Normal urethral meatus, normal Bartholin's and Skene's glands.    Vagina: Normal vaginal mucosa, no evidence of prolapse.    Cervix: Grossly normal in appearance, no bleeding  Uterus: Non-enlarged, mobile, normal contour.  No CMT  Adnexa: ovaries non-enlarged, no adnexal masses  Rectal: deferred  Lymphatic: no evidence of inguinal lymphadenopathy Extremities: no edema, erythema, or tenderness Neurologic: Grossly intact Psychiatric: mood appropriate, affect full  Female chaperone present for pelvic and breast  portions of the physical exam    Assessment: 45 y.o. [id number] routine annual exam  Plan: Problem List Items Addressed This Visit   None   Visit Diagnoses    Encounter for gynecological examination without abnormal finding    -  Primary   Breast screening       Routine screening for STI (sexually transmitted infection)       Relevant Orders   Cervicovaginal ancillary only   STD exposure       Relevant Orders   Cervicovaginal ancillary only   Abnormal uterine bleeding       Relevant Orders   US PELVIS TRANSVAGINAL NON-OB (TV ONLY)      1) Mammogram - recommend yearly screening mammogram.  Mammogram Was ordered today   2) STI screening  was notoffered and therefore not obtained  3) ASCCP guidelines and rational discussed.  Patient opts for every 3 years  screening interval  4) Contraception - the patient is currently using  condoms.  She is happy with her current form of contraception and plans to continue  5) Colonoscopy -- Screening recommended starting at age 12 for average risk individuals, age 65 for individuals deemed at increased risk (including African Americans) and recommended to continue until age 19.  For patient age 31-85 individualized approach is recommended.  Gold standard screening is via colonoscopy, Cologuard screening is an acceptable alternative for patient unwilling or unable to undergo colonoscopy.  "Colorectal cancer screening for average?risk adults: 2018 guideline update from the American Cancer Society"CA: A Cancer Journal for Clinicians: Jun 12, 2016   6) Routine healthcare maintenance including cholesterol, diabetes screening discussed managed by PCP  7) Return in about 1 year (around 06/06/2021) for annual.   Malachy Mood, MD, [facility], [place] 06/06/2020, 4:08 PM

## 2020-06-07 ENCOUNTER — Other Ambulatory Visit: Payer: Self-pay

## 2020-06-07 MED ORDER — LIDOCAINE (ANORECTAL) 5 % EX CREA: 1.0000 | TOPICAL_CREAM | Freq: Four times a day (QID) | CUTANEOUS | 0 refills | Status: AC | PRN

## 2020-06-08 LAB — CERVICOVAGINAL ANCILLARY ONLY
Chlamydia: NEGATIVE
Comment: NEGATIVE
Comment: NEGATIVE
Comment: NORMAL
Neisseria Gonorrhea: NEGATIVE
Trichomonas: NEGATIVE

## 2020-06-14 ENCOUNTER — Encounter: Payer: Self-pay | Admitting: Obstetrics & Gynecology

## 2020-06-14 ENCOUNTER — Ambulatory Visit (INDEPENDENT_AMBULATORY_CARE_PROVIDER_SITE_OTHER): Payer: Medicaid Other | Admitting: Obstetrics & Gynecology

## 2020-06-14 ENCOUNTER — Other Ambulatory Visit: Payer: Self-pay

## 2020-06-14 VITALS — BP 100/60 | Ht 63.0 in | Wt 166.0 lb

## 2020-06-14 DIAGNOSIS — N362 Urethral caruncle: Secondary | ICD-10-CM

## 2020-06-14 MED ORDER — ESTROGENS, CONJUGATED 0.625 MG/GM VA CREA: 500.0000 mg | TOPICAL_CREAM | Freq: Every day | VAGINAL | 3 refills | Status: DC

## 2020-06-14 NOTE — Progress Notes (Signed)
HPI:      [name] is a 45 y.o. [id number] who LMP was Patient's last menstrual period was 05/23/2020 (approximate)., presents today for a problem visit.  She complains of: few day h/o irritative cyst or lump in her vagina (anterior side, near urethra) that then had some spontaneous drainage.  Mild pain.  She went to see urgent care and they placed her on Bactrim and referred to [facility].  No fever, chills, other sx's.    PMHx: She  has no past medical history on file. Also,  has a past surgical history that includes Wisdom tooth extraction; Breast surgery (2016); Colonoscopy with propofol (N/A, 01/28/2019); and Esophagogastroduodenoscopy (egd) with propofol (N/A, 01/28/2019)., family history includes COPD in her mother; Dementia in her mother; Diabetes in her mother; Thyroid disease in her mother.,  reports that she quit smoking about 19 months ago. Her smoking use included cigarettes. She has never used smokeless tobacco. She reports current alcohol use. She reports that she does not use drugs.  She has a current medication list which includes the following prescription(s): conjugated estrogens, sulfamethoxazole-trimethoprim, and lidocaine (anorectal). Also, has No Known Allergies.  Review of Systems  Constitutional: Negative for chills, fever and malaise/fatigue.  HENT: Negative for congestion, sinus pain and sore throat.   Eyes: Negative for blurred vision and pain.  Respiratory: Negative for cough and wheezing.   Cardiovascular: Negative for chest pain and leg swelling.  Gastrointestinal: Negative for abdominal pain, constipation, diarrhea, heartburn, nausea and vomiting.  Genitourinary: Negative for dysuria, frequency, hematuria and urgency.  Musculoskeletal: Negative for back pain, joint pain, myalgias and neck pain.  Skin: Negative for itching and rash.  Neurological: Negative for dizziness, tremors and weakness.  Endo/Heme/Allergies: Does not bruise/bleed easily.   Psychiatric/Behavioral: Negative for depression. The patient is not nervous/anxious and does not have insomnia.     Objective: BP 100/60   Ht 5\' 3"  (1.6 m)   Wt 166 lb (75.3 kg)   LMP 05/23/2020 (Approximate)   BMI 29.41 kg/m  Physical Exam Constitutional:      General: She is not in acute distress.    Appearance: She is well-developed.  Genitourinary:     Bladder normal.     Right Labia: No rash, tenderness or lesions.    Left Labia: No tenderness, lesions or rash.    Vulva exam comments: No Bartholins.     No vaginal erythema or bleeding.      Right Adnexa: not tender and no mass present.    Left Adnexa: not tender and no mass present.    No cervical motion tenderness, discharge, polyp or nabothian cyst.     Uterus is not enlarged.     No uterine mass detected.    Urethral tenderness present.     Urethra exam comments: Small tender urethral caruncle beneath meatus.   HENT:     Head: Normocephalic and atraumatic.     Nose: Nose normal.  Abdominal:     General: There is no distension.     Palpations: Abdomen is soft.     Tenderness: There is no abdominal tenderness.  Musculoskeletal:        General: Normal range of motion.  Neurological:     Mental Status: She is alert and oriented to person, place, and time.     Cranial Nerves: No cranial nerve deficit.  Skin:    General: Skin is warm and dry.  Psychiatric:        Attention and Perception: Attention  normal.        Mood and Affect: Mood and affect normal.        Speech: Speech normal.        Behavior: Behavior normal.        Thought Content: Thought content normal.        Judgment: Judgment normal.     ASSESSMENT/PLAN:    Problem List Items Addressed This Visit      Genitourinary   Urethral caruncle - Primary    Warm baths, monitor regression Estrogen vaginal cream at urethral meatus daily for 2 weeks then twice weekly for 2 mos Finish Bactrim as Rx by uregent care, to prevent UTI or cellulitis F/u  PRN  Barnett Applebaum, MD, [facility], [facility] 06/14/2020  3:37 PM

## 2020-08-24 ENCOUNTER — Telehealth: Payer: Self-pay | Admitting: Obstetrics and Gynecology

## 2020-08-24 NOTE — Telephone Encounter (Signed)
Left message for patient to call office back to make appt for u/s f/u from [facility]

## 2020-09-05 ENCOUNTER — Ambulatory Visit: Payer: Medicaid Other | Admitting: Obstetrics and Gynecology

## 2020-09-14 ENCOUNTER — Ambulatory Visit (INDEPENDENT_AMBULATORY_CARE_PROVIDER_SITE_OTHER): Payer: Medicaid Other | Admitting: Obstetrics and Gynecology

## 2020-09-14 ENCOUNTER — Encounter: Payer: Self-pay | Admitting: Obstetrics and Gynecology

## 2020-09-14 ENCOUNTER — Other Ambulatory Visit: Payer: Self-pay

## 2020-09-14 VITALS — BP 110/71 | Ht 63.0 in | Wt 157.0 lb

## 2020-09-14 DIAGNOSIS — N8003 Adenomyosis of the uterus: Secondary | ICD-10-CM

## 2020-09-14 DIAGNOSIS — N800 Endometriosis of uterus: Secondary | ICD-10-CM

## 2020-09-14 DIAGNOSIS — N939 Abnormal uterine and vaginal bleeding, unspecified: Secondary | ICD-10-CM | POA: Diagnosis not present

## 2020-09-14 MED ORDER — NORETHINDRONE ACETATE 5 MG PO TABS: 5.0000 mg | ORAL_TABLET | Freq: Every day | ORAL | 11 refills | Status: AC

## 2020-09-14 NOTE — Progress Notes (Signed)
Gynecology Ultrasound Follow Up  Chief Complaint:  Chief Complaint  Patient presents with   Follow-up    BIBC U/S - RM 3     History of Present Illness: Patient is a 45 y.o. female who presents today for ultrasound evaluation of AUB  Ultrasound demonstrates the following findgins Adnexa: 1cm complex right ovarian cyst hemorrhagic vs dermoid, left 2cm corpus luteum cyst Uterus: globally enlarged and heterogenous myometrium with endometrial stripe without focal lesions  Additional: no free fluid  Review of Systems: Review of Systems  Constitutional: Negative.   Gastrointestinal: Negative.   Genitourinary: Negative.    Past Medical History:  No past medical history on file.  Past Surgical History:  Past Surgical History:  Procedure Laterality Date   BREAST SURGERY  2016   Breast Reduction   COLONOSCOPY WITH PROPOFOL N/A 01/28/2019   Procedure: COLONOSCOPY WITH PROPOFOL;  Surgeon: Lin Landsman, MD;  Location: [facility] ENDOSCOPY;  Service: Gastroenterology;  Laterality: N/A;   ESOPHAGOGASTRODUODENOSCOPY (EGD) WITH PROPOFOL N/A 01/28/2019   Procedure: ESOPHAGOGASTRODUODENOSCOPY (EGD) WITH PROPOFOL;  Surgeon: Lin Landsman, MD;  Location: [facility] ENDOSCOPY;  Service: Gastroenterology;  Laterality: N/A;   WISDOM TOOTH EXTRACTION      Gynecologic History:  Patient's last menstrual period was 08/27/2020. Last Pap: 11/04/2018 Results were: . NILM HPV negative  Family History:  Family History  Problem Relation Age of Onset   Diabetes Mother    Thyroid disease Mother    COPD Mother    Dementia Mother     Social History:  Social History   Socioeconomic History   Marital status: Single    Spouse name: Not on file   Number of children: 2   Years of education: 12   Highest education level: Not on file  Occupational History   Occupation: [employer]  Tobacco Use   Smoking status: Former    Types: Cigarettes    Quit date: 10/21/2018    Years since quitting: 1.9    Smokeless tobacco: Never  Vaping Use   Vaping Use: Never used  Substance and Sexual Activity   Alcohol use: Yes   Drug use: Never   Sexual activity: Yes    Birth control/protection: None  Other Topics Concern   Not on file  Social History Narrative   Not on file   Social Determinants of Health   Financial Resource Strain: Not on file  Food Insecurity: Not on file  Transportation Needs: Not on file  Physical Activity: Not on file  Stress: Not on file  Social Connections: Not on file  Intimate Partner Violence: Not on file    Allergies:  No Known Allergies  Medications: Prior to Admission medications   Medication Sig Start Date End Date Taking? Authorizing Provider  norethindrone (AYGESTIN) 5 MG tablet Take 1 tablet (5 mg total) by mouth daily. 09/14/20  Yes Malachy Mood, MD  conjugated estrogens (PREMARIN) vaginal cream Place [id number] Applicatorfuls vaginally daily. Daily for 2 weeks then twice weekly 06/14/20   Gae Dry, MD  Lidocaine, Anorectal, 5 % CREA Apply 1 applicator topically 4 (four) times daily as needed. 06/07/20   Olean Ree, MD  sulfamethoxazole-trimethoprim (BACTRIM DS) 800-160 MG tablet Take 1 tablet by mouth 2 (two) times daily. 06/13/20   [provider]    Physical Exam Vitals: Blood pressure 110/71, height '5\' 3"'$  (1.6 m), weight 157 lb (71.2 kg), last menstrual period 08/27/2020.  General: NAD HEENT: normocephalic, anicteric Pulmonary: No increased work of breathing Extremities: no edema,  erythema, or tenderness Neurologic: Grossly intact, normal gait Psychiatric: mood appropriate, affect full  Ultrasound 08/23/2020  Impression  1.Enlarged and heterogeneous-appearing uterus without focal myometrial mass lesion identified, which could potentially be seen in the setting of adenomyosis.  2.Additional note made of a hyperechoic region within the right ovary which measures up to 1.0 cm and demonstrates questionable posterior acoustic  enhancement. There is no appreciable vascularity within this lesion on color Doppler images obtained. Differential considerations include an involuting corpus luteum or small dermoid. Repeat endovaginal ultrasound is recommended in 8-12 weeks to ensure stability/resolution.  3.Left-sided corpus luteal cyst, measuring up to 2.0 cm in greatest dimension (O-RADS 1). No further follow up is required.  Narrative  EXAM: [place] ENDOVAGINAL (NON-OB)  DATE: 08/23/2020  ACCESSION: [id number]  DICTATED: 08/23/2020 2:04 PM  INTERPRETATION LOCATION: [place]   CLINICAL INDICATION: 45 years old Female with aub  - N93.9 - Abnormal uterine bleeding (AUB)     TECHNIQUE: Ultrasound views of the pelvis were obtained endovaginally using gray scale and color Doppler imaging.   COMPARISON: No prior imaging available.   FINDINGS:   UTERUS/CERVIX:  Anteverted in position. The uterus is enlarged and heterogeneous appearance, which is nonspecific, however, may be seen in the setting of adenomyosis. No discrete myometrial mass lesion identified. Uterus measures 8.1 x 4.3 x 6.8 cm. The endometrium is normal in thickness measuring 0.7 cm.   Scattered cervical nabothian cysts were identified.   OVARIES:  Right ovary measures 4.9 x 2.0 x 1.9 cm and left ovary measures 3.3 x 2.1 x 2.0 cm. Small cystic areas were seen within both ovaries compatible with follicles. Appropriate flow was documented on color Doppler imaging.   Within the left ovary, note is made of a hypoechoic cystic structure with thick echogenic rim, and slight increased vascularity along its periphery, most consistent with a corpus luteal cyst measuring up to 2.0 cm in greatest dimension.   Within the right ovary, there is ill-defined hyperechoic region which measures up to 1.0 cm in greatest dimension. This lesion is without definite vascularity on color Doppler images obtained. Question some associated posterior acoustic shadowing, which could  potentially reflect an ovarian teratoma verus other ovarian lesion.   OTHER: No abnormal pelvic free fluid. Procedure Note  Charissa Bash, MD - 08/23/2020  Formatting of this note might be different from the original.  EXAM: [place] ENDOVAGINAL (NON-OB)  DATE: 08/23/2020  ACCESSION: [id number]  DICTATED: 08/23/2020 2:04 PM  INTERPRETATION LOCATION: [place]   CLINICAL INDICATION: 45 years old Female with aub  - N93.9 - Abnormal uterine bleeding (AUB)     TECHNIQUE: Ultrasound views of the pelvis were obtained endovaginally using gray scale and color Doppler imaging.   COMPARISON: No prior imaging available.   FINDINGS:   UTERUS/CERVIX:  Anteverted in position. The uterus is enlarged and heterogeneous appearance, which is nonspecific, however, may be seen in the setting of adenomyosis. No discrete myometrial mass lesion identified. Uterus measures 8.1 x 4.3 x 6.8 cm. The endometrium is normal in thickness measuring 0.7 cm.   Scattered cervical nabothian cysts were identified.   OVARIES:  Right ovary measures 4.9 x 2.0 x 1.9 cm and left ovary measures 3.3 x 2.1 x 2.0 cm. Small cystic areas were seen within both ovaries compatible with follicles. Appropriate flow was documented on color Doppler imaging.   Within the left ovary, note is made of a hypoechoic cystic structure with thick echogenic rim, and slight increased vascularity along its periphery, most  consistent with a corpus luteal cyst measuring up to 2.0 cm in greatest dimension.   Within the right ovary, there is ill-defined hyperechoic region which measures up to 1.0 cm in greatest dimension. This lesion is without definite vascularity on color Doppler images obtained. Question some associated posterior acoustic shadowing, which could potentially reflect an ovarian teratoma verus other ovarian lesion.   OTHER: No abnormal pelvic free fluid.   IMPRESSION:  1.Enlarged and heterogeneous-appearing uterus without focal  myometrial mass lesion identified, which could potentially be seen in the setting of adenomyosis.  2.Additional note made of a hyperechoic region within the right ovary which measures up to 1.0 cm and demonstrates questionable posterior acoustic enhancement. There is no appreciable vascularity within this lesion on color Doppler images obtained. Differential considerations include an involuting corpus luteum or small dermoid. Repeat endovaginal ultrasound is recommended in 8-12 weeks to ensure stability/resolution.  3.Left-sided corpus luteal cyst, measuring up to 2.0 cm in greatest dimension (O-RADS 1). No further follow up is required.  Exam End: 08/23/20 14:12   Assessment: 45 y.o. [id number] AUB-A   Plan: Problem List Items Addressed This Visit   None Visit Diagnoses     Abnormal uterine bleeding    -  Primary   Adenomyosis           1) AUB-A - Norethindrone Rx provided, discussed IUD or attempt at other progestin  2) A total of 15 minutes were spent in face-to-face contact with the patient during this encounter with over half of that time devoted to counseling and coordination of care.  3) Return in about 3 months (around 12/14/2020) for medication follow up phone or in person.    Malachy Mood, MD, [facility], [facility] 09/14/2020, 3:31 PM

## 2020-12-14 ENCOUNTER — Ambulatory Visit (INDEPENDENT_AMBULATORY_CARE_PROVIDER_SITE_OTHER): Payer: Medicaid Other | Admitting: Obstetrics and Gynecology

## 2020-12-14 ENCOUNTER — Other Ambulatory Visit: Payer: Self-pay

## 2020-12-14 ENCOUNTER — Encounter: Payer: Self-pay | Admitting: Obstetrics and Gynecology

## 2020-12-14 DIAGNOSIS — N946 Dysmenorrhea, unspecified: Secondary | ICD-10-CM

## 2020-12-14 DIAGNOSIS — N8003 Adenomyosis of the uterus: Secondary | ICD-10-CM

## 2020-12-14 NOTE — Progress Notes (Signed)
I connected with Jody Sanders on 12/14/20 at  9:10 AM EST by telephone and verified that I am speaking with the correct person using two identifiers.   I discussed the limitations, risks, security and privacy concerns of performing an evaluation and management service by telephone and the availability of in person appointments. I also discussed with the patient that there may be a patient responsible charge related to this service. The patient expressed understanding and agreed to proceed.  The patient was at home I spoke with the patient from my workstation phone The names of people involved in this encounter were: Jody Sanders , and [facility] Visit   Chief Complaint:  Chief Complaint  Patient presents with   Follow-up    Phone visit - medication follow up, not taking everyday, no concerns.    History of Present Illness: 45 y.o. [id number] presenting for medication follow up for a diagnosis of adenomyosis  She is currently being managed with norethindrone.   The patient reports improvement in symptoms but continued dysmenorrhea slightly less .  On her current medication regimen has intermittent breakthrough bleeding.   She has not noted any side-effects or new symptoms.    Has not been taking daily because of concerns about weight gain.  Review of Systems: Review of Systems  Constitutional: Negative.   Gastrointestinal: Negative.   Genitourinary: Negative.     Past Medical History:  No past medical history on file.  Past Surgical History:  Past Surgical History:  Procedure Laterality Date   BREAST SURGERY  2016   Breast Reduction   COLONOSCOPY WITH PROPOFOL N/A 01/28/2019   Procedure: COLONOSCOPY WITH PROPOFOL;  Surgeon: Lin Landsman, MD;  Location: [facility] ENDOSCOPY;  Service: Gastroenterology;  Laterality: N/A;   ESOPHAGOGASTRODUODENOSCOPY (EGD) WITH PROPOFOL N/A 01/28/2019   Procedure: ESOPHAGOGASTRODUODENOSCOPY (EGD) WITH  PROPOFOL;  Surgeon: Lin Landsman, MD;  Location: [facility] ENDOSCOPY;  Service: Gastroenterology;  Laterality: N/A;   WISDOM TOOTH EXTRACTION      Gynecologic History: Patient's last menstrual period was 12/08/2020.  Obstetric History: [id number]  Family History:  Family History  Problem Relation Age of Onset   Diabetes Mother    Thyroid disease Mother    COPD Mother    Dementia Mother     Social History:  Social History   Socioeconomic History   Marital status: Single    Spouse name: Not on file   Number of children: 2   Years of education: 12   Highest education level: Not on file  Occupational History   Occupation: [employer]  Tobacco Use   Smoking status: Former    Types: Cigarettes    Quit date: 10/21/2018    Years since quitting: 2.1   Smokeless tobacco: Never  Vaping Use   Vaping Use: Never used  Substance and Sexual Activity   Alcohol use: Yes   Drug use: Never   Sexual activity: Yes    Birth control/protection: Pill  Other Topics Concern   Not on file  Social History Narrative   Not on file   Social Determinants of Health   Financial Resource Strain: Not on file  Food Insecurity: Not on file  Transportation Needs: Not on file  Physical Activity: Not on file  Stress: Not on file  Social Connections: Not on file  Intimate Partner Violence: Not on file    Allergies:  No Known Allergies  Medications: Prior to Admission medications   Medication [employer]  Date End Date Taking? Authorizing Provider  Lidocaine, Anorectal, 5 % CREA Apply 1 applicator topically 4 (four) times daily as needed. 06/07/20  Yes Piscoya, Jacqulyn Bath, MD  norethindrone (AYGESTIN) 5 MG tablet Take 1 tablet (5 mg total) by mouth daily. 09/14/20  Yes Malachy Mood, MD  conjugated estrogens (PREMARIN) vaginal cream Place 5.00 Applicatorfuls vaginally daily. Daily for 2 weeks then twice weekly 06/14/20   Gae Dry, MD  sulfamethoxazole-trimethoprim (BACTRIM DS) 800-160 MG tablet Take 1  tablet by mouth 2 (two) times daily. 06/13/20   [provider]    Physical Exam Vitals: There were no vitals filed for this visit. Patient's last menstrual period was 12/08/2020.  No physical exam as this was a remote telephone visit to promote social distancing during the current COVID-19 Pandemic   Assessment: 45 y.o. [id number] follow up adenomyosis and dysmenorrhea  Plan: Problem List Items Addressed This Visit   None Visit Diagnoses     Adenomyosis    -  Primary   Dysmenorrhea          1) Adenomyosis - has noted improvement in bleeding with norethindrone but has not taken daily.  Felt menstrual cycle was lighter but still some cramping.  We discussed goal is for amenorrhea and in order to achieve this we need daily dosing.  She may opt to dose reduce to 1/2 pill or 2.5mg  if she is doing well.  Once again discussed IUD as an option that would limit hormone exposure.  Ablation can be considered but ablation failure with adenomyosis are likely increased beyond the 27% at 4 years quoted rate.  Hysterectomy remains an option for treatment failures.  2) Telephone time 5:56min  3) Return in about 6 months (around 06/14/2021) for annual exam.   Malachy Mood, MD, [facility], [facility] 12/14/2020, 9:13 AM

## 2021-01-24 ENCOUNTER — Telehealth: Payer: Self-pay | Admitting: Obstetrics and Gynecology

## 2021-01-24 NOTE — Telephone Encounter (Signed)
Pt called in stating that her discharge has increased instead of decreased.  She doesn't know if it has something to do with the RX that she was given.  She would like to speak with you when you get a chance.

## 2021-01-25 NOTE — Telephone Encounter (Signed)
So I have not seen her for discharge

## 2021-01-26 NOTE — Telephone Encounter (Signed)
The norethindrone may cause an increase in cervical mucous which has an egg white type consistency and is clear to offwhite

## 2021-01-26 NOTE — Telephone Encounter (Signed)
She was saying her discharge has increased since she saw you last and since she was put on a certain type of medication.  She  was wondering if the medication had something to do with the discharge.

## 2021-01-26 NOTE — Telephone Encounter (Signed)
Patient has appointment Monday 01/29/21. drl

## 2021-01-29 ENCOUNTER — Encounter: Payer: Self-pay | Admitting: Licensed Practical Nurse

## 2021-01-29 ENCOUNTER — Other Ambulatory Visit: Payer: Self-pay

## 2021-01-29 ENCOUNTER — Ambulatory Visit: Payer: Medicaid Other | Admitting: Licensed Practical Nurse

## 2021-01-29 VITALS — BP 100/70 | Ht 62.0 in | Wt 165.0 lb

## 2021-01-29 DIAGNOSIS — Z113 Encounter for screening for infections with a predominantly sexual mode of transmission: Secondary | ICD-10-CM | POA: Diagnosis not present

## 2021-01-29 DIAGNOSIS — N898 Other specified noninflammatory disorders of vagina: Secondary | ICD-10-CM | POA: Diagnosis not present

## 2021-01-29 NOTE — Progress Notes (Signed)
°  HPI:      Jody Sanders is a 46 y.o. [id number] who LMP was Patient's last menstrual period was 01/05/2021 (exact date)., presents today for a problem visit.  She complains of:  Vaginitis: Patient complains of an abnormal vaginal discharge for 2 weeks.  It has increased in amount, it is white and a little thick.  Denies any odor or vaginal irritation. She did have spotting about 2 weeks ago for 3-4 days.  Denies any pain with IC. Is sexually active with 1 female partner-same partner x 2.5 years.  She was recently started on Norethindrone to stop her periods.   Is open to all STI screenings.  PMHx: She  has a past medical history of No pertinent past medical history. Also,  has a past surgical history that includes Wisdom tooth extraction; Breast surgery (2016); Colonoscopy with propofol (N/A, 01/28/2019); and Esophagogastroduodenoscopy (egd) with propofol (N/A, 01/28/2019)., family history includes COPD in her mother; Colon cancer in her maternal grandmother; Dementia in her mother; Diabetes in her mother; Thyroid disease in her mother.,  reports that she quit smoking about 2 years ago. Her smoking use included cigarettes. She has never used smokeless tobacco. She reports current alcohol use. She reports that she does not use drugs.  She has a current medication list which includes the following prescription(s): lidocaine (anorectal) and norethindrone. Also, has No Known Allergies.  Review of Systems  Constitutional:  Negative for fever and weight loss.  Gastrointestinal:  Positive for constipation.       Occasional cramping  Genitourinary:        Spotting, increased vaginal discharge    Objective: BP 100/70    Ht 5\' 2"  (1.575 m)    Wt 165 lb (74.8 kg)    LMP 01/05/2021 (Exact Date)    BMI 30.18 kg/m  Physical Exam Genitourinary:     Vulva normal.     Genitourinary Comments: Cervix pink, no lesions, Small to moderate amount of homogenous white discharge present   HENT:     Mouth/Throat:      Mouth: Mucous membranes are moist.  Pulmonary:     Breath sounds: Normal breath sounds.  Abdominal:     General: Abdomen is flat.  Musculoskeletal:     Cervical back: Normal range of motion.  Neurological:     Mental Status: She is alert and oriented to person, place, and time.  Psychiatric:        Mood and Affect: Mood normal.   Wet prep negative for ph change, whiff, clue,trich or hyphea  ASSESSMENT/PLAN:    Problem List Items Addressed This Visit   None Visit Diagnoses     Screening examination for sexually transmitted disease    -  Primary   Relevant Orders   HEP, RPR, HIV Panel   Hepatitis C antibody   NuSwab Vaginitis Plus (VG+)   Vaginal discharge       Relevant Orders   NuSwab Vaginitis Plus (VG+)      -Will call with any abnormal results -Annual exam due in May   Jody Sanders, [place], [facility]  01/29/21  9:37 AM

## 2021-01-30 LAB — HEPATITIS C ANTIBODY: Hep C Virus Ab: <0.1 s/co ratio (ref 0.0–0.9)

## 2021-01-30 LAB — HEP, RPR, HIV PANEL
HIV Screen 4th Generation wRfx: NONREACTIVE
Hepatitis B Surface Ag: NEGATIVE
RPR Ser Ql: NONREACTIVE

## 2021-01-31 LAB — NUSWAB VAGINITIS PLUS (VG+)
Atopobium vaginae: HIGH {score} — AB
BVAB 2: HIGH {score} — AB
Candida albicans, NAA: NEGATIVE
Candida glabrata, NAA: NEGATIVE
Chlamydia trachomatis, NAA: NEGATIVE
Megasphaera 1: HIGH {score} — AB
Neisseria gonorrhoeae, NAA: NEGATIVE
Trich vag by NAA: NEGATIVE

## 2021-02-01 ENCOUNTER — Other Ambulatory Visit: Payer: Self-pay | Admitting: Licensed Practical Nurse

## 2021-02-01 DIAGNOSIS — B9689 Other specified bacterial agents as the cause of diseases classified elsewhere: Secondary | ICD-10-CM

## 2021-02-01 DIAGNOSIS — B3731 Acute candidiasis of vulva and vagina: Secondary | ICD-10-CM

## 2021-02-01 DIAGNOSIS — N760 Acute vaginitis: Secondary | ICD-10-CM

## 2021-02-01 MED ORDER — FLUCONAZOLE 150 MG PO TABS: 150.0000 mg | ORAL_TABLET | ORAL | 0 refills | Status: AC

## 2021-02-01 MED ORDER — METRONIDAZOLE 500 MG PO TABS: 500.0000 mg | ORAL_TABLET | Freq: Two times a day (BID) | ORAL | 0 refills | Status: AC

## 2021-02-01 NOTE — Progress Notes (Signed)
TC to Jody Sanders: Your swab shows that you have B.V. reviewed nature of B.V and treatment. Paxton reports that she tends to get yeast infections when she takes anabiotics, requesting Diflucan.  Script for Flagyl and Diflucan [facility] to [facility] [place], [place] [place] [facility]  02/01/21  12:19 PM

## 2021-04-25 ENCOUNTER — Other Ambulatory Visit: Payer: Self-pay

## 2021-04-25 ENCOUNTER — Emergency Department
Admission: EM | Admit: 2021-04-25 | Discharge: 2021-04-25 | Disposition: A | Discharge status: Home / Self Care | Payer: Medicaid Other | Attending: Emergency Medicine | Admitting: Emergency Medicine

## 2021-04-25 ENCOUNTER — Ambulatory Visit (INDEPENDENT_AMBULATORY_CARE_PROVIDER_SITE_OTHER): Payer: Medicaid Other

## 2021-04-25 ENCOUNTER — Encounter: Payer: Self-pay | Admitting: Emergency Medicine

## 2021-04-25 DIAGNOSIS — O0281 Inappropriate change in quantitative human chorionic gonadotropin (hCG) in early pregnancy: Secondary | ICD-10-CM

## 2021-04-25 DIAGNOSIS — Z3689 Encounter for other specified antenatal screening: Secondary | ICD-10-CM

## 2021-04-25 DIAGNOSIS — O039 Complete or unspecified spontaneous abortion without complication: Secondary | ICD-10-CM

## 2021-04-25 DIAGNOSIS — Z3A00 Weeks of gestation of pregnancy not specified: Secondary | ICD-10-CM | POA: Diagnosis not present

## 2021-04-25 DIAGNOSIS — O209 Hemorrhage in early pregnancy, unspecified: Secondary | ICD-10-CM | POA: Diagnosis present

## 2021-04-25 DIAGNOSIS — O2691 Pregnancy related conditions, unspecified, first trimester: Secondary | ICD-10-CM

## 2021-04-25 LAB — BASIC METABOLIC PANEL
Anion gap: 6 (ref 5–15)
BUN: 10 mg/dL (ref 6–20)
CO2: 22 mmol/L (ref 22–32)
Calcium: 8.8 mg/dL — ABNORMAL LOW (ref 8.9–10.3)
Chloride: 108 mmol/L (ref 98–111)
Creatinine, Ser: 0.86 mg/dL (ref 0.44–1.00)
GFR, Estimated: >60 mL/min (ref >60)
Glucose, Bld: 191 mg/dL — ABNORMAL HIGH (ref 70–99)
Potassium: 3.7 mmol/L (ref 3.5–5.1)
Sodium: 136 mmol/L (ref 135–145)

## 2021-04-25 LAB — URINALYSIS, ROUTINE W REFLEX MICROSCOPIC
Bacteria, UA: NONE SEEN
Bilirubin Urine: NEGATIVE
Glucose, UA: 150 mg/dL — AB
Ketones, ur: NEGATIVE mg/dL
Leukocytes,Ua: NEGATIVE
Nitrite: NEGATIVE
Protein, ur: 30 mg/dL — AB
Specific Gravity, Urine: 1.025 (ref 1.005–1.030)
pH: 5 (ref 5.0–8.0)

## 2021-04-25 LAB — CBC
HCT: 35.7 % — ABNORMAL LOW (ref 36.0–46.0)
Hemoglobin: 11.3 g/dL — ABNORMAL LOW (ref 12.0–15.0)
MCH: 28.8 pg (ref 26.0–34.0)
MCHC: 31.7 g/dL (ref 30.0–36.0)
MCV: 90.8 fL (ref 80.0–100.0)
Platelets: 273 10*3/uL (ref 150–400)
RBC: 3.93 MIL/uL (ref 3.87–5.11)
RDW: 16.3 % — ABNORMAL HIGH (ref 11.5–15.5)
WBC: 6.6 10*3/uL (ref 4.0–10.5)
nRBC: 0 % (ref 0.0–0.2)

## 2021-04-25 LAB — PREGNANCY, URINE: Preg Test, Ur: NEGATIVE

## 2021-04-25 LAB — HCG, QUANTITATIVE, PREGNANCY: hCG, Beta Chain, Quant, S: <1 m[IU]/mL (ref <5)

## 2021-04-25 NOTE — ED Triage Notes (Signed)
Pt via POV from home. Pt vaginal bleeding and abd cramping since last night. LMP 3/5, pt just found out she was pregnant. Denies any clots. Pt is A&OX4 and NAD.  ?

## 2021-04-25 NOTE — Discharge Instructions (Signed)
You can take Tylenol 1 g every 8 hours or ibuprofen 600 every 6-8 hours with food and return to the ER if develop fevers, worsening pain or any other concerns ?

## 2021-04-25 NOTE — ED Provider Notes (Signed)
? ?[facility] ?Provider Note ? ? ? Event Date/Time  ? First MD Initiated Contact with Patient 04/25/21 1516   ?  (approximate) ? ? ?History  ? ?Vaginal Bleeding ? ? ?HPI ? ?LEIGH BLAS is a 46 y.o. female who comes in with vaginal bleeding.  Patient's LMP is 03/18/2021 patient is a G8, P2.  I reviewed the OB note from today.  On review of this note patient was planned to have an abortion as she was not planning on getting pregnant.   ? ?Patient reports that she had a positive pregnancy test on Easter. Took two at home. She stated that she started having a little bit of vaginal bleeding today and then had a larger gush.  Not larger than her normal periods.  She denies any other symptoms or concerns she just want to know if she was pregnant because she is planning on having an abortion.  She denies any other issues at this time. ? ? ?Physical Exam  ? ?Triage Vital Signs: ?ED Triage Vitals  ?Enc Vitals Group  ?   BP 04/25/21 1421 (!) 124/102  ?   Pulse Rate 04/25/21 1421 78  ?   Resp 04/25/21 1421 20  ?   Temp 04/25/21 1421 97.7 ?F (36.5 ?C)  ?   Temp Source 04/25/21 1421 Oral  ?   SpO2 04/25/21 1421 98 %  ?   Weight 04/25/21 1345 170 lb (77.1 kg)  ?   Height 04/25/21 1345 '5\' 2"'$  (1.575 m)  ?   Head Circumference --   ?   Peak Flow --   ?   Pain Score 04/25/21 1345 7  ?   Pain Loc --   ?   Pain Edu? --   ?   Excl. in [place]? --   ? ? ?Most recent vital signs: ?Vitals:  ? 04/25/21 1421  ?BP: (!) 124/102  ?Pulse: 78  ?Resp: 20  ?Temp: 97.7 ?F (36.5 ?C)  ?SpO2: 98%  ? ? ? ?General: Awake, no distress.  ?CV:  Good peripheral perfusion.  ?Resp:  Normal effort.  ?Abd:  No distention. Soft and non tender  ?Other:   ? ? ?ED Results / Procedures / Treatments  ? ?Labs ?(all labs ordered are listed, but only abnormal results are displayed) ?Labs Reviewed  ?CBC - Abnormal; Notable for the following components:  ?    Result Value  ? Hemoglobin 11.3 (*)   ? HCT 35.7 (*)   ? RDW 16.3 (*)   ? All other components  within normal limits  ?BASIC METABOLIC PANEL - Abnormal; Notable for the following components:  ? Glucose, Bld 191 (*)   ? Calcium 8.8 (*)   ? All other components within normal limits  ?URINALYSIS, ROUTINE W REFLEX MICROSCOPIC - Abnormal; Notable for the following components:  ? Color, Urine YELLOW (*)   ? APPearance CLEAR (*)   ? Glucose, UA 150 (*)   ? Hgb urine dipstick LARGE (*)   ? Protein, ur 30 (*)   ? All other components within normal limits  ?HCG, QUANTITATIVE, PREGNANCY  ?PREGNANCY, URINE  ? ? ?IMPRESSION / MDM / ASSESSMENT AND PLAN / ED COURSE  ?I reviewed the triage vital signs and the nursing notes. ?             ?               ? ?Differential diagnosis includes, but is not limited to, pregnancy, miscarriage,  ectopic.  Doubt ovarian torsion or cyst rupture given no significant pain on one side. ? ?Pregnancy test was negative.  Her CBC shows hemoglobin of 11.3.  BMP is reassuring.  UA with some hemoglobin most likely from the vaginal bleeding.  hCG is negative ? ?Given negative hCG I suspect patient had a chemical pregnancy/miscarriage.  Recommended getting Rh status to see if she needs RhoGAM but patient declined stating that she is not planning on having any future children and plans to have her tubes tied.  Again explained that there is any chance that she would want to try to get pregnant again that I would definitely recommend that we do this however she is adamant that she does not want this done and declines testing and treatment if Rh-. ? ?Patient requesting work note provided for 2 days.  Will return if symptoms are worsening or any other concerns ? ? ? ? ? ?FINAL CLINICAL IMPRESSION(S) / ED DIAGNOSES  ? ?Final diagnoses:  ?Chemical pregnancy  ?Miscarriage  ? ? ? ?Rx / DC Orders  ? ?ED Discharge Orders   ? ? None  ? ?  ? ? ? ?Note:  This document was prepared using Dragon voice recognition software and may include unintentional dictation errors. ?  ?Vanessa Linden, MD ?04/25/21 1555 ? ?

## 2021-04-25 NOTE — Progress Notes (Signed)
New OB Intake  I connected with  Jody Sanders on 04/25/21 at 10:15 AM EDT by telephone Telephone Visit and verified that I am speaking with the correct person using two identifiers. Nurse is located at [facility] and pt is located at home.  I explained I am completing New OB Intake today. We discussed her EDD of 12/23/2021 that is based on LMP of 03/18/2021. Pt is G8/P2. I reviewed her allergies, medications, Medical/Surgical/OB history, and appropriate screenings. Based on history, this is a complicated  pregnancy by spotting and advanced maternal age.  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  Delivery Plans:  Plans to have an abortion as she wasn't planning on getting pregnant but wanted to continue this appt and her NOB appt in order to get a note from [place] confirming she is pregnant so she can have an abortion.    Labs Discussed MaterniT-21 genetic screening with patient. Would like drawn at new OB visit.Routine prenatal labs needed.  Covid Vaccine Patient has covid vaccine.    Social Determinants of Health Food Insecurity: Patient denies food insecurity. WIC Referral: Patient is not interested in referral to [facility].  Transportation: Patient denies transportation needs. Childcare: Discussed no children allowed at ultrasound appointments.   First visit review I reviewed new OB appt with pt. I explained she will have a pelvic exam, ob bloodwork with genetic screening, and PAP smear. Explained pt will be seen by Roberto Scales, CNM at first visit; encounter routed to appropriate provider.    Cleophas Dunker, CMA(AAMA) 04/25/2021  10:18 AM

## 2021-05-14 ENCOUNTER — Encounter: Payer: Medicaid Other | Admitting: Licensed Practical Nurse

## 2021-05-14 ENCOUNTER — Encounter: Payer: Medicaid Other | Admitting: Advanced Practice Midwife

## 2021-09-03 ENCOUNTER — Other Ambulatory Visit: Payer: Self-pay | Admitting: Family Medicine

## 2021-09-03 DIAGNOSIS — Z1231 Encounter for screening mammogram for malignant neoplasm of breast: Secondary | ICD-10-CM

## 2021-11-12 ENCOUNTER — Ambulatory Visit
Admission: RE | Admit: 2021-11-12 | Discharge: 2021-11-12 | Disposition: A | Discharge status: Home / Self Care | Payer: Medicaid Other | Source: Ambulatory Visit | Attending: Chiropractor | Admitting: Chiropractor

## 2021-11-12 ENCOUNTER — Other Ambulatory Visit: Payer: Self-pay | Admitting: Chiropractor

## 2021-11-12 DIAGNOSIS — S29019A Strain of muscle and tendon of unspecified wall of thorax, initial encounter: Secondary | ICD-10-CM

## 2021-11-12 DIAGNOSIS — S161XXA Strain of muscle, fascia and tendon at neck level, initial encounter: Secondary | ICD-10-CM | POA: Insufficient documentation

## 2021-11-12 DIAGNOSIS — S39012A Strain of muscle, fascia and tendon of lower back, initial encounter: Secondary | ICD-10-CM

## 2021-12-12 ENCOUNTER — Other Ambulatory Visit: Payer: Self-pay | Admitting: Licensed Practical Nurse

## 2021-12-12 DIAGNOSIS — B9689 Other specified bacterial agents as the cause of diseases classified elsewhere: Secondary | ICD-10-CM

## 2022-03-15 ENCOUNTER — Ambulatory Visit (INDEPENDENT_AMBULATORY_CARE_PROVIDER_SITE_OTHER): Payer: Medicaid Other

## 2022-03-15 VITALS — BP 100/60 | Ht 63.0 in | Wt 168.0 lb

## 2022-03-15 DIAGNOSIS — Z3202 Encounter for pregnancy test, result negative: Secondary | ICD-10-CM | POA: Diagnosis not present

## 2022-03-15 DIAGNOSIS — Z3200 Encounter for pregnancy test, result unknown: Secondary | ICD-10-CM

## 2022-03-15 DIAGNOSIS — N912 Amenorrhea, unspecified: Secondary | ICD-10-CM

## 2022-03-15 LAB — POCT URINE PREGNANCY: Preg Test, Ur: NEGATIVE

## 2022-03-15 NOTE — Progress Notes (Signed)
Subjective:    Jody Sanders is a 47 y.o. female who presents for evaluation of amenorrhea. She believes she could be pregnant. Last period was normal.   Patient's last menstrual period was 02/01/2021 (exact date). The following portions of the patient's history were reviewed and updated as appropriate: allergies, current medications, past family history, past medical history, past social history, past surgical history, and problem list. Positive home test on 03/06/22, had bleeding 04/11/22 passed a blood clot hand still bleeding today, moderate flow, bright red.  Lab Review Urine HCG: negative    Assessment:    Absence of menstruation.     Plan:    Pregnancy Test:  Negative: Pt was sent to lab for a beta and will come back Monday to repeat Beta. Pt wants to make sure she has passed everything if this is a miscarriage.

## 2022-03-16 LAB — BETA HCG QUANT (REF LAB): hCG Quant: <1 m[IU]/mL

## 2022-03-18 ENCOUNTER — Other Ambulatory Visit: Payer: Medicaid Other

## 2022-03-18 ENCOUNTER — Telehealth: Payer: Self-pay | Admitting: Licensed Practical Nurse

## 2022-03-18 NOTE — Telephone Encounter (Signed)
Reached out to pt to reschedule lab appt that was scheduled on 03/18/2022 at 10:00.  Left message for pt to call back.

## 2022-03-19 ENCOUNTER — Encounter: Payer: Self-pay | Admitting: Licensed Practical Nurse

## 2022-03-19 NOTE — Telephone Encounter (Signed)
Reached out to pt (2x) to reschedule lab appt that was scheduled for 03/18/2022 at 10:00.  Left message for pt to call back.  Will send a MyChart letter.

## 2022-05-08 ENCOUNTER — Ambulatory Visit: Payer: Medicaid Other

## 2022-05-08 NOTE — Progress Notes (Deleted)
    NURSE VISIT NOTE  Subjective:    Patient ID: Jody Sanders, female    DOB: 01/13/1976, 47 y.o.   MRN: [id number]  HPI  Patient is a 47 y.o. [id number] female who presents for evaluation of amenorrhea. She believes she could be pregnant. {pregnancy desire:14500::"Pregnancy is desired."} Sexual Activity: {sexual partners:705}. Current symptoms also include: {preg sx:18128}. Last period was {norm/abn:16337}.    Objective:    There were no vitals taken for this visit.  Lab Review  No results found for any visits on 05/08/22.  Assessment:   No diagnosis found.  Plan:   {AOB + PREGNANCY PLAN:28596:p}  {AOB - PREGNANCY PLAN:28597:p}   Cornelius Moras, CMA

## 2022-07-26 ENCOUNTER — Emergency Department
Admission: EM | Admit: 2022-07-26 | Discharge: 2022-07-26 | Disposition: A | Discharge status: Home / Self Care | Payer: Medicaid Other | Source: Home / Self Care | Attending: Emergency Medicine | Admitting: Emergency Medicine

## 2022-07-26 ENCOUNTER — Other Ambulatory Visit: Payer: Self-pay

## 2022-07-26 DIAGNOSIS — N3090 Cystitis, unspecified without hematuria: Secondary | ICD-10-CM | POA: Diagnosis not present

## 2022-07-26 DIAGNOSIS — R350 Frequency of micturition: Secondary | ICD-10-CM | POA: Diagnosis present

## 2022-07-26 LAB — URINALYSIS, ROUTINE W REFLEX MICROSCOPIC
Bilirubin Urine: NEGATIVE
Glucose, UA: NEGATIVE mg/dL
Hgb urine dipstick: NEGATIVE
Ketones, ur: 5 mg/dL — AB
Nitrite: NEGATIVE
Protein, ur: 100 mg/dL — AB
Specific Gravity, Urine: 1.027 (ref 1.005–1.030)
WBC, UA: >50 WBC/hpf (ref 0–5)
pH: 5 (ref 5.0–8.0)

## 2022-07-26 LAB — POC URINE PREG, ED: Preg Test, Ur: NEGATIVE

## 2022-07-26 MED ORDER — PHENAZOPYRIDINE HCL 200 MG PO TABS: 200.0000 mg | ORAL_TABLET | Freq: Three times a day (TID) | ORAL | 0 refills | Status: AC | PRN

## 2022-07-26 MED ORDER — CEPHALEXIN 500 MG PO CAPS: 500.0000 mg | ORAL_CAPSULE | Freq: Two times a day (BID) | ORAL | 0 refills | Status: AC

## 2022-07-26 MED ORDER — PHENAZOPYRIDINE HCL 200 MG PO TABS
200.0000 mg | ORAL_TABLET | Freq: Once | ORAL | Status: AC
Start: 2022-07-26 — End: 2022-07-26
  Administered 2022-07-26: 200 mg via ORAL
  Filled 2022-07-26: qty 1

## 2022-07-26 MED ORDER — PHENAZOPYRIDINE HCL 100 MG PO TABS: 95.0000 mg | ORAL_TABLET | Freq: Once | ORAL | Status: DC

## 2022-07-26 MED ORDER — CEPHALEXIN 500 MG PO CAPS
500.0000 mg | ORAL_CAPSULE | Freq: Once | ORAL | Status: AC
  Administered 2022-07-26: 500 mg via ORAL
  Filled 2022-07-26: qty 1

## 2022-07-26 NOTE — ED Provider Notes (Signed)
[facility] Provider Note    Event Date/Time   First MD Initiated Contact with Patient 07/26/22 1642     (approximate)   History   Urinary Tract Infection   HPI  Jody Sanders is a 47 y.o. female who presents with suprapubic pressure, urinary frequency, hesitancy, dysuria, all acutely since this morning.  She denies any back or flank pain.  She has no fever or chills.  She denies feeling dizzy or lightheaded.  I reviewed the past medical records per the patient's most recent outpatient encounter was on 3/1 with OB/GYN for evaluation of amenorrhea.   Physical Exam   Triage Vital Signs: ED Triage Vitals [07/26/22 1635]  Encounter Vitals Group     BP (!) 95/50     Systolic BP Percentile      Diastolic BP Percentile      Pulse Rate 97     Resp 18     Temp 98.5 F (36.9 C)     Temp Source Oral     SpO2 99 %     Weight 167 lb 15.9 oz (76.2 kg)     Height 5\' 3"  (1.6 m)     Head Circumference      Peak Flow      Pain Score 10     Pain Loc      Pain Education      Exclude from Growth Chart     Most recent vital signs: Vitals:   07/26/22 1635 07/26/22 1724  BP: (!) 95/50 122/67  Pulse: 97 75  Resp: 18   Temp: 98.5 F (36.9 C)   SpO2: 99% 99%     General: Awake, no distress.  CV:  Good peripheral perfusion. Resp:  Normal effort.  Abd:  Soft and nontender.  No distention.  Other:  No jaundice or scleral icterus.   ED Results / Procedures / Treatments   Labs (all labs ordered are listed, but only abnormal results are displayed) Labs Reviewed  URINALYSIS, ROUTINE W REFLEX MICROSCOPIC - Abnormal; Notable for the following components:      Result Value   Color, Urine YELLOW (*)    APPearance CLOUDY (*)    Ketones, ur 5 (*)    Protein, ur 100 (*)    Leukocytes,Ua LARGE (*)    Bacteria, UA RARE (*)    All other components within normal limits  POC URINE PREG, ED     EKG     RADIOLOGY    PROCEDURES:  Critical Care  performed: No  Procedures   MEDICATIONS ORDERED IN ED: Medications  cephALEXin (KEFLEX) capsule 500 mg (has no administration in time range)  phenazopyridine (PYRIDIUM) tablet 200 mg (has no administration in time range)     IMPRESSION / MDM / ASSESSMENT AND PLAN / ED COURSE  I reviewed the triage vital signs and the nursing notes.  47 year old female with PMH as noted above presents with suprapubic pain and urinary symptoms acute onset this morning.  She is well-appearing on exam.  Blood pressure is borderline low initially although the patient denies any lightheadedness or symptoms of hypotension.  Differential diagnosis includes, but is not limited to, UTI/cystitis, less likely urinary retention.  We will obtain urinalysis as well as a bladder scan and reassess.  Patient's presentation is most consistent with acute complicated illness / injury requiring diagnostic workup.  ----------------------------------------- 5:49 PM on 07/26/2022 -----------------------------------------  Urinalysis is consistent with UTI with greater than 50 WBCs and leukocyte esterase.  Bladder scan shows no retained urine in the bladder.  Repeat blood pressure is normal.  At this time, the patient is stable for discharge home with treatment for cystitis.  There is no evidence of pyelonephritis or of ureteral stone.  I have prescribed her Keflex and Pyridium.  I gave strict return precautions and she expressed understanding.   FINAL CLINICAL IMPRESSION(S) / ED DIAGNOSES   Final diagnoses:  Cystitis     Rx / DC Orders   ED Discharge Orders          Ordered    phenazopyridine (PYRIDIUM) 200 MG tablet  3 times daily PRN        07/26/22 1748    cephALEXin (KEFLEX) 500 MG capsule  2 times daily        07/26/22 1748             Note:  This document was prepared using Dragon voice recognition software and may include unintentional dictation errors.    Dionne Bucy, MD 07/26/22  1749

## 2022-07-26 NOTE — ED Notes (Signed)
See triage notes. Patient stated she began having urgency and pressure when urinating today

## 2022-07-26 NOTE — ED Triage Notes (Signed)
Pt here with a UTI that started this morning. Pt states the pressure is severe. Pt states when she urinates only drops come out.

## 2022-07-26 NOTE — Discharge Instructions (Addendum)
Take the Keflex twice daily as prescribed and finish the full 1 week course.  You may take the Pyridium as needed for urinary discomfort.  Return to the ER for new, worsening, or persistent severe abdominal pain, flank or back pain, fever or chills, vomiting, or any other new or worsening symptoms that concern you.

## 2022-08-21 ENCOUNTER — Telehealth: Payer: Self-pay

## 2022-08-21 NOTE — Telephone Encounter (Signed)
Pls call pt to schedule GYN visit and annual.  Pt called triage and said she has been spotting the last two weeks, she is wondering if this has to do with menopause. Pt is sexually active, has not taken a pregnancy test because she doesn't think she's pregnant. I advised the patient she should be seen for a problem visit and she is also past due for her annual.

## 2022-08-21 NOTE — Telephone Encounter (Signed)
I contacted the patient via phone, She is scheduled for 8/20 with JA for annual exam and plans to discuss her spotting.

## 2022-09-03 ENCOUNTER — Ambulatory Visit: Payer: Medicaid Other | Admitting: Certified Nurse Midwife

## 2023-02-26 ENCOUNTER — Ambulatory Visit: Payer: Medicaid Other
